# Patient Record
Sex: Female | Born: 1970
Health system: Southern US, Community
[De-identification: ages and names within clinical notes are randomized; demographics above are authoritative.]

## PROBLEM LIST (undated history)

## (undated) DIAGNOSIS — F419 Anxiety disorder, unspecified: Secondary | ICD-10-CM

## (undated) DIAGNOSIS — I10 Essential (primary) hypertension: Secondary | ICD-10-CM

## (undated) HISTORY — DX: Essential (primary) hypertension: I10

## (undated) HISTORY — PX: HERNIA REPAIR: SHX51

---

## 2020-11-20 LAB — LAB REPORT - SCANNED: Cologuard: NEGATIVE

## 2020-11-27 LAB — COLOGUARD: COLOGUARD: NEGATIVE

## 2021-05-18 ENCOUNTER — Ambulatory Visit: Admit: 2021-05-18 | Payer: Self-pay

## 2021-05-18 ENCOUNTER — Ambulatory Visit
Admission: EM | Admit: 2021-05-18 | Discharge: 2021-05-18 | Disposition: A | Discharge status: Home / Self Care | Payer: Self-pay | Attending: Emergency Medicine | Admitting: Emergency Medicine

## 2021-05-18 ENCOUNTER — Other Ambulatory Visit: Payer: Self-pay

## 2021-05-18 DIAGNOSIS — R030 Elevated blood-pressure reading, without diagnosis of hypertension: Secondary | ICD-10-CM

## 2021-05-18 DIAGNOSIS — F418 Other specified anxiety disorders: Secondary | ICD-10-CM

## 2021-05-18 MED ORDER — LORAZEPAM 1 MG PO TABS: 1.0000 mg | ORAL_TABLET | ORAL | 0 refills | Status: DC | PRN

## 2021-05-18 NOTE — Discharge Instructions (Addendum)
After speaking with you today and hearing the recent sources of stress in your life which are significant in my opinion, I believe that you are suffering from whitecoat syndrome without diagnosis of hypertension.  For this reason, I provided you with a prescription for 2 tablets of lorazepam 1 mg to be taken as needed prior to dental procedures in hopes that this prevents you from having an elevated blood pressure read when you go to have dental work done.  Please take 1 tablet 1 to 2 hours prior to when you plan to have your procedure done.  Please do not drive while taking this medication.  We will assist you in finding a primary care provider today, expect to receive a phone call in the next day or 2 to help you set something up.

## 2021-05-18 NOTE — ED Provider Notes (Signed)
[facility]    CSN: [id number] Arrival date & time: 05/18/21  1249      History   Chief Complaint No chief complaint on file.   HPI Janice Ramirez is a 50 y.o. female.   Patient states she was advised by her dentist yesterday that she has hypertension and was advised to seek medical attention before they will perform her dental visit.  Patient denies a history of hypertension diagnosis, states she has never been treated for high blood pressure.  Blood pressure on arrival today was 166/76.  After speaking with the patient for a few minutes discussing her current stressors including recently breaking a tooth, recently moving to [place] from [place], not having a primary care provider, not currently having any active health insurance or dental insurance as well as now being told that she may have hypertension, I rechecked her blood pressure and it rose to 189/104.  Patient does not report a family history of hypertension in her mother, states her mother is now 3 years old and doing very well and blood pressure is very well managed.  Patient states that she has essentially no meaningful past medical history, states she is athletic, exercises regularly, eats well, prior to move [place] had regular visits with her primary care provider.  Patient states she has been advised that she has a benign essential murmur but that her primary care provider was not concerned about it and did not believe that she required further evaluation or treatment.  The history is provided by the patient.   History reviewed. No pertinent past medical history.  There are no problems to display for this patient.   History reviewed. No pertinent surgical history.  OB History   No obstetric history on file.      Home Medications    Prior to Admission medications   Medication Sig Start Date End Date Taking? Authorizing Provider  LORazepam (ATIVAN) 1 MG tablet Take 1 tablet (1 mg  total) by mouth as needed for up to 2 doses for anxiety or sedation (For dental procedures). 05/18/21  Yes Lynden Oxford Scales, PA-C    Family History No family history on file.  Social History Social History   Tobacco Use   Smoking status: Some Days    Types: Cigarettes   Smokeless tobacco: Never  Vaping Use   Vaping Use: Some days  Substance Use Topics   Alcohol use: Yes   Drug use: Yes    Types: Marijuana     Allergies   Patient has no allergy information on record.   Review of Systems Review of Systems Pertinent findings noted in history of present illness.    Physical Exam Triage Vital Signs ED Triage Vitals  Enc Vitals Group     BP 05/11/21 0827 (!) 147/82     Pulse Rate 05/11/21 0827 72     Resp 05/11/21 0827 18     Temp 05/11/21 0827 98.3 F (36.8 C)     Temp Source 05/11/21 0827 Oral     SpO2 05/11/21 0827 98 %     Weight --      Height --      Head Circumference --      Peak Flow --      Pain Score 05/11/21 0826 5     Pain Loc --      Pain Edu? --      Excl. in [place]? --    No data found.  Updated  Vital Signs BP (!) 166/76 (BP Location: Left Arm)   Pulse 64   Temp 98.4 F (36.9 C) (Oral)   Resp 18   LMP 05/04/2021   SpO2 100%   Visual Acuity Right Eye Distance:   Left Eye Distance:   Bilateral Distance:    Right Eye Near:   Left Eye Near:    Bilateral Near:     Physical Exam Vitals and nursing note reviewed.  Constitutional:      General: She is not in acute distress.    Appearance: Normal appearance. She is not ill-appearing.     Comments: Patient appears mildly anxious today.  HENT:     Head: Normocephalic and atraumatic.  Eyes:     General: Lids are normal.        Right eye: No discharge.        Left eye: No discharge.     Extraocular Movements: Extraocular movements intact.     Conjunctiva/sclera: Conjunctivae normal.     Right eye: Right conjunctiva is not injected.     Left eye: Left conjunctiva is not injected.   Neck:     Trachea: Trachea and phonation normal.  Cardiovascular:     Rate and Rhythm: Normal rate and regular rhythm.     Pulses: Normal pulses.     Heart sounds: Murmur heard.    No friction rub. No gallop.  Pulmonary:     Effort: Pulmonary effort is normal. No accessory muscle usage, prolonged expiration or respiratory distress.     Breath sounds: Normal breath sounds. No stridor, decreased air movement or transmitted upper airway sounds. No decreased breath sounds, wheezing, rhonchi or rales.  Chest:     Chest wall: No tenderness.  Musculoskeletal:        General: Normal range of motion.     Cervical back: Normal range of motion and neck supple. Normal range of motion.  Lymphadenopathy:     Cervical: No cervical adenopathy.  Skin:    General: Skin is warm and dry.     Findings: No erythema or rash.  Neurological:     General: No focal deficit present.     Mental Status: She is alert and oriented to person, place, and time.  Psychiatric:        Mood and Affect: Mood normal.        Behavior: Behavior normal.     UC Treatments / Results  Labs (all labs ordered are listed, but only abnormal results are displayed) Labs Reviewed - No data to display  EKG   Radiology No results found.  Procedures Procedures (including critical care time)  Medications Ordered in UC Medications - No data to display  Initial Impression / Assessment and Plan / UC Course  I have reviewed the triage vital signs and the nursing notes.  Pertinent labs & imaging results that were available during my care of the patient were reviewed by me and considered in my medical decision making (see chart for details).     Patient I had a long discussion about dental anxiety, whitecoat syndrome and her risk of hypertension being African-American and also having history of hypertension in her family.  I was unable to get an accurate blood pressure reading on her today, patient was in no apparent  distress other than appearing anxious about her current situation.  I recommended that she consider taking a benzodiazepine prior to her next dental visit and that I will document my findings in her after visit  summary today so she can take that paper with her to her next dental visit.  We will also assist her with finding a primary care provider.  Patient verbalized understanding and agreement of plan as discussed.  I sent prescription for lorazepam 1 mg, 2 doses to her pharmacy today.  All questions were addressed during visit.  Please see discharge instructions below for further details of plan.  Final Clinical Impressions(s) / UC Diagnoses   Final diagnoses:  White coat syndrome without diagnosis of hypertension  Situational anxiety     Discharge Instructions      After speaking with you today and hearing the recent sources of stress in your life which are significant in my opinion, I believe that you are suffering from whitecoat syndrome without diagnosis of hypertension.  For this reason, I provided you with a prescription for 2 tablets of lorazepam 1 mg to be taken as needed prior to dental procedures in hopes that this prevents you from having an elevated blood pressure read when you go to have dental work done.  Please take 1 tablet 1 to 2 hours prior to when you plan to have your procedure done.  Please do not drive while taking this medication.  We will assist you in finding a primary care provider today, expect to receive a phone call in the next day or 2 to help you set something up.     ED Prescriptions     Medication Sig Dispense Auth. Provider   LORazepam (ATIVAN) 1 MG tablet Take 1 tablet (1 mg total) by mouth as needed for up to 2 doses for anxiety or sedation (For dental procedures). 2 tablet Lynden Oxford Scales, PA-C      PDMP not reviewed this encounter.    Lynden Oxford Scales, PA-C 05/18/21 1709

## 2021-05-18 NOTE — ED Triage Notes (Signed)
Pt reports at dental appt yesterday she had hypertension. Pt denies having a hx of HTN, denies vision changes and headaches.

## 2021-06-12 ENCOUNTER — Ambulatory Visit (HOSPITAL_COMMUNITY): Admission: EM | Admit: 2021-06-12 | Payer: Self-pay

## 2021-06-13 ENCOUNTER — Other Ambulatory Visit: Payer: Self-pay

## 2021-06-13 ENCOUNTER — Encounter (HOSPITAL_COMMUNITY): Payer: Self-pay

## 2021-06-13 ENCOUNTER — Ambulatory Visit (HOSPITAL_COMMUNITY)
Admission: RE | Admit: 2021-06-13 | Discharge: 2021-06-13 | Disposition: A | Discharge status: Home / Self Care | Payer: Self-pay | Source: Ambulatory Visit | Attending: Urgent Care | Admitting: Urgent Care

## 2021-06-13 VITALS — BP 156/82 | HR 72 | Temp 98.3°F | Resp 18

## 2021-06-13 DIAGNOSIS — I10 Essential (primary) hypertension: Secondary | ICD-10-CM

## 2021-06-13 DIAGNOSIS — R030 Elevated blood-pressure reading, without diagnosis of hypertension: Secondary | ICD-10-CM

## 2021-06-13 DIAGNOSIS — R519 Headache, unspecified: Secondary | ICD-10-CM

## 2021-06-13 HISTORY — DX: Anxiety disorder, unspecified: F41.9

## 2021-06-13 MED ORDER — AMLODIPINE BESYLATE 5 MG PO TABS: 5.0000 mg | ORAL_TABLET | Freq: Every day | ORAL | 0 refills | Status: DC

## 2021-06-13 NOTE — ED Triage Notes (Signed)
Pt presents with intermittent headache X 2 weeks.

## 2021-06-13 NOTE — Discharge Instructions (Addendum)
Do not use any nonsteroidal anti-inflammatories (NSAIDs) like ibuprofen, Motrin, naproxen, Aleve, etc. which are all available over-the-counter.  Please just use Tylenol at a dose of 557m-650mg once every 6 hours as needed for your headaches, pains, fevers. You can also use Excedrin for your headaches.

## 2021-06-13 NOTE — ED Provider Notes (Signed)
[place]   MRN: [id number] DOB: [birth year]  Subjective:   Janice Ramirez is a 50 y.o. female presenting for 2-week history of persistent intermittent left-sided frontal to left temporal headaches with intermittent buzzing in her left ear.  Has also had occasional oral pain.  Denies fevers, blurred vision, photophobia, weakness, numbness or tingling, neck pain, jaw pain, chest pain, shortness of breath, heart racing, nausea, vomiting, abdominal pain, hematuria.  Has 1 cigarette in the morning with her coffee.  No neck stiffness, neck pain.  Patient's mother does have a history of hypertension.  No other family history.  Patient has never been diagnosed with this either.  She admits that she has been under a lot of stress that she is trying to apply for the bar.  Otherwise she tries to eat healthily, exercises regularly.  She is trying to get established with a PCP, has an appointment in January.  No current facility-administered medications for this encounter.  Current Outpatient Medications:    LORazepam (ATIVAN) 1 MG tablet, Take 1 tablet (1 mg total) by mouth as needed for up to 2 doses for anxiety or sedation (For dental procedures)., Disp: 2 tablet, Rfl: 0   No Known Allergies  Past Medical History:  Diagnosis Date   Anxiety      History reviewed. No pertinent surgical history.  Family History  Family history unknown: Yes    Social History   Tobacco Use   Smoking status: Some Days    Types: Cigarettes   Smokeless tobacco: Never  Vaping Use   Vaping Use: Some days  Substance Use Topics   Alcohol use: Yes   Drug use: Yes    Types: Marijuana    ROS   Objective:   Vitals: BP (!) 156/82 (BP Location: Right Arm)   Pulse 72   Temp 98.3 F (36.8 C) (Oral)   Resp 18   SpO2 99%   BP Readings from Last 3 Encounters:  06/13/21 (!) 156/82  05/18/21 (!) 166/76   Physical Exam Constitutional:      General: She is not in acute distress.     Appearance: Normal appearance. She is well-developed. She is not ill-appearing.  HENT:     Head: Normocephalic and atraumatic.     Right Ear: Tympanic membrane and ear canal normal. No drainage or tenderness. No middle ear effusion. Tympanic membrane is not erythematous.     Left Ear: Tympanic membrane and ear canal normal. No drainage or tenderness.  No middle ear effusion. Tympanic membrane is not erythematous.     Nose: Nose normal. No congestion or rhinorrhea.     Mouth/Throat:     Mouth: Mucous membranes are moist. No oral lesions.     Pharynx: Oropharynx is clear. No pharyngeal swelling, oropharyngeal exudate, posterior oropharyngeal erythema or uvula swelling.     Tonsils: No tonsillar exudate or tonsillar abscesses.  Eyes:     General: No scleral icterus.       Right eye: No discharge.        Left eye: No discharge.     Extraocular Movements: Extraocular movements intact.     Right eye: Normal extraocular motion.     Left eye: Normal extraocular motion.     Conjunctiva/sclera: Conjunctivae normal.     Pupils: Pupils are equal, round, and reactive to light.  Cardiovascular:     Rate and Rhythm: Normal rate.  Pulmonary:     Effort: Pulmonary effort is normal.  Musculoskeletal:  Cervical back: Normal range of motion and neck supple.  Lymphadenopathy:     Cervical: No cervical adenopathy.  Skin:    General: Skin is warm and dry.  Neurological:     General: No focal deficit present.     Mental Status: She is alert and oriented to person, place, and time.     Cranial Nerves: No cranial nerve deficit, dysarthria or facial asymmetry.     Motor: No weakness, tremor, abnormal muscle tone or pronator drift.     Coordination: Romberg sign negative. Coordination normal. Finger-Nose-Finger Test and Heel to [facility] Test normal. Rapid alternating movements normal.     Gait: Gait normal.     Deep Tendon Reflexes: Reflexes normal.  Psychiatric:        Mood and Affect: Mood normal.         Behavior: Behavior normal.        Thought Content: Thought content normal.        Judgment: Judgment normal.    Assessment and Plan :   PDMP not reviewed this encounter.  1. Essential hypertension   2. Elevated blood pressure reading   3. Intermittent headache    Patient has a completely normal neurologic exam, clear cardiopulmonary exam, no carotid bruits.  Low suspicion for an acute encephalopathy, stroke, meningitis.  Discussed hypertension as a source of her symptoms. Will have her start amlodipine. Use Tylenol, Excedrin for her headaches. Continue efforts at establishing care with a new PCP. Counseled patient on potential for adverse effects with medications prescribed/recommended today, strict ER and return-to-clinic precautions discussed, patient verbalized understanding.    Jaynee Eagles, PA-C 06/13/21 1346

## 2021-06-25 ENCOUNTER — Other Ambulatory Visit: Payer: Self-pay

## 2021-06-25 ENCOUNTER — Encounter (HOSPITAL_BASED_OUTPATIENT_CLINIC_OR_DEPARTMENT_OTHER): Payer: Self-pay | Admitting: Family Medicine

## 2021-06-25 ENCOUNTER — Ambulatory Visit (INDEPENDENT_AMBULATORY_CARE_PROVIDER_SITE_OTHER): Payer: Self-pay | Admitting: Family Medicine

## 2021-06-25 DIAGNOSIS — I10 Essential (primary) hypertension: Secondary | ICD-10-CM | POA: Insufficient documentation

## 2021-06-25 MED ORDER — AMLODIPINE BESYLATE 5 MG PO TABS: 5.0000 mg | ORAL_TABLET | Freq: Every day | ORAL | 0 refills | Status: DC

## 2021-06-25 NOTE — Progress Notes (Signed)
New Patient Office Visit  Subjective:  Patient ID: Janice Ramirez, female    DOB: Jul 19, 1970  Age: 50 y.o. MRN: [id number]  CC:  Chief Complaint  Patient presents with   Establish Care    Prior PCP in [place]. Recently relocated   Hypertension    Patient would like to discuss HTN and management. She states this is a fairly new dx for her, she found out while at the dentist. She was seen in the ED and started on a low dose of amlodipine. She states her headaches and dizziness has resolved but her BP still remains high    HPI Janice Ramirez is a 50 year old female presenting to establish in clinic.  She has current concerns as outlined above.  Reports past medical history of hypertension, recently diagnosed.  Hypertension: Recently since moving to the area she was having symptoms of intermittent headaches and was found to have elevated blood pressure emergency department.  About 2 weeks ago she was started on amlodipine.  She has been tolerating medication well.  She has been checking her blood pressure at home and reports getting readings of about 130/80.  Rarely she has had some lightheadedness/dizziness.  She has primarily been taking her blood pressure medication at night.  Does report family history of hypertension in her mother.  Patient is originally from [place], has been living in [place] since she was 15 and then recently moved here to [place] to escape the cold and pace of [place].  She is not currently working at present but is looking for a position.  She is an attorney by training, focusing on housing/property law.  Outside of work she enjoys being active, particularly walking about 5 miles per day, doing yoga, cooking, watching TV, reading.  Past Medical History:  Diagnosis Date   Anxiety    Hypertension 05/10/21    Past Surgical History:  Procedure Laterality Date   HERNIA REPAIR  Approx. 1992    Family History  Problem Relation Age of Onset    Arthritis Mother    Hypertension Mother    Early death Father     Social History   Socioeconomic History   Marital status: Single    Spouse name: Not on file   Number of children: Not on file   Years of education: Not on file   Highest education level: Not on file  Occupational History   Not on file  Tobacco Use   Smoking status: Some Days    Packs/day: 0.10    Years: 20.00    Pack years: 2.00    Types: Cigarettes, E-cigarettes   Smokeless tobacco: Never   Tobacco comments:    On and off for about 20 yrs  Vaping Use   Vaping Use: Some days  Substance and Sexual Activity   Alcohol use: Yes    Alcohol/week: 3.0 standard drinks    Types: 3 Standard drinks or equivalent per week   Drug use: Not Currently    Types: Marijuana    Comment: A few times per year   Sexual activity: Not Currently    Birth control/protection: Condom  Other Topics Concern   Not on file  Social History Narrative   Recently relocated from [place] Strain: Not on file  Food Insecurity: Not on file  Transportation Needs: Not on file  Physical Activity: Not on file  Stress: Not on file  Social Connections: Not on  file  Intimate Partner Violence: Not on file    Objective:   Today's Vitals: BP (!) 142/106   Pulse (!) 52   Ht 5' 9"  (1.753 m)   Wt 122 lb 12.8 oz (55.7 kg)   LMP 06/03/2021   SpO2 96%   BMI 18.13 kg/m   Physical Exam  50 year old female in no acute distress Cardiovascular exam with regular rate and rhythm, no murmurs appreciated Lungs clear to auscultation bilaterally  Assessment & Plan:   Problem List Items Addressed This Visit       Cardiovascular and Mediastinum   Hypertension    Recently diagnosed hypertension, blood pressure in office today is slightly elevated, possibly elevated due to new position office, environment Reports adequately controlled blood pressure readings at home At this time, will continue  with current medication regimen Discussed lifestyle modifications including DASH diet, regular physical activity Plan for follow-up in about 3 months to monitor blood pressure      Relevant Medications   amLODipine (NORVASC) 5 MG tablet    Will also provide handout of sleep hygiene guidelines due to reported issues with sleep some nights.  Outpatient Encounter Medications as of 06/25/2021  Medication Sig   LORazepam (ATIVAN) 1 MG tablet Take 1 tablet (1 mg total) by mouth as needed for up to 2 doses for anxiety or sedation (For dental procedures).   [DISCONTINUED] amLODipine (NORVASC) 5 MG tablet Take 1 tablet (5 mg total) by mouth daily.   amLODipine (NORVASC) 5 MG tablet Take 1 tablet (5 mg total) by mouth daily.   No facility-administered encounter medications on file as of 06/25/2021.    Follow-up: Return in about 3 months (around 09/23/2021) for Follow Up.  Patient also requesting referral to OB/GYN, will proceed with this once patient has insurance established.  Firas Guardado J De [place], MD

## 2021-06-25 NOTE — Assessment & Plan Note (Signed)
Recently diagnosed hypertension, blood pressure in office today is slightly elevated, possibly elevated due to new position office, environment Reports adequately controlled blood pressure readings at home At this time, will continue with current medication regimen Discussed lifestyle modifications including DASH diet, regular physical activity Plan for follow-up in about 3 months to monitor blood pressure

## 2021-06-25 NOTE — Patient Instructions (Signed)
  Medication Instructions:  Your physician recommends that you continue on your current medications as directed. Please refer to the Current Medication list given to you today. --If you need a refill on any your medications before your next appointment, please call your pharmacy first. If no refills are authorized on file call the office.--  Follow-Up: Your next appointment:   Your physician recommends that you schedule a follow-up appointment in: 3 MONTHS with Dr. de [place]  You will receive a text message or e-mail with a link to a survey about your care and experience with [place] today! We would greatly appreciate your feedback!   Thanks for letting us be apart of your health journey!!  Primary Care and Sports Medicine   Dr. Arlina Robes [place]   We encourage you to activate your patient portal called "MyChart".  Sign up information is provided on this After Visit Summary.  MyChart is used to connect with patients for Virtual Visits (Telemedicine).  Patients are able to view lab/test results, encounter notes, upcoming appointments, etc.  Non-urgent messages can be sent to your provider as well. To learn more about what you can do with MyChart, please visit --  [email or web address].

## 2021-09-24 ENCOUNTER — Ambulatory Visit (HOSPITAL_BASED_OUTPATIENT_CLINIC_OR_DEPARTMENT_OTHER): Payer: Self-pay | Admitting: Family Medicine

## 2021-10-03 ENCOUNTER — Encounter (HOSPITAL_BASED_OUTPATIENT_CLINIC_OR_DEPARTMENT_OTHER): Payer: Self-pay | Admitting: Family Medicine

## 2021-10-03 ENCOUNTER — Other Ambulatory Visit: Payer: Self-pay

## 2021-10-03 ENCOUNTER — Ambulatory Visit (INDEPENDENT_AMBULATORY_CARE_PROVIDER_SITE_OTHER): Payer: 59 | Admitting: Family Medicine

## 2021-10-03 VITALS — BP 116/66 | HR 77 | Temp 97.4°F | Ht 69.0 in | Wt 127.0 lb

## 2021-10-03 DIAGNOSIS — I10 Essential (primary) hypertension: Secondary | ICD-10-CM

## 2021-10-03 DIAGNOSIS — R1013 Epigastric pain: Secondary | ICD-10-CM | POA: Diagnosis not present

## 2021-10-03 MED ORDER — LOSARTAN POTASSIUM 50 MG PO TABS: 50.0000 mg | ORAL_TABLET | Freq: Every day | ORAL | 1 refills | Status: DC

## 2021-10-03 NOTE — Patient Instructions (Addendum)
Bland Diet ?A bland diet consists of foods that are often soft and do not have a lot of fat, fiber, or extra seasonings. Foods without fat, fiber, or seasoning are easier for the body to digest. They are also less likely to irritate your mouth, throat, stomach, and other parts of your digestive system. A bland diet is sometimes called a BRAT diet. ?What is my plan? ?Your health care provider or food and nutrition specialist (dietitian) may recommend specific changes to your diet to prevent symptoms or to treat your symptoms. These changes may include: ?Eating small meals often. ?Cooking food until it is soft enough to chew easily. ?Chewing your food well. ?Drinking fluids slowly. ?Not eating foods that are very spicy, sour, or fatty. ?Not eating citrus fruits, such as oranges and grapefruit. ?What do I need to know about this diet? ?Eat a variety of foods from the bland diet food list. ?Do not follow a bland diet longer than needed. ?Ask your health care provider whether you should take vitamins or supplements. ?What foods can I eat? ?Grains ?Hot cereals, such as cream of wheat. Rice. Bread, crackers, or tortillas made from refined white flour. ?Vegetables ?Canned or cooked vegetables. Mashed or boiled potatoes. ?Fruits ?Bananas. Applesauce. Other types of cooked or canned fruit with the skin and seeds removed, such as canned peaches or pears. ?Meats and other proteins ?Scrambled eggs. Creamy peanut butter or other nut butters. Lean, well-cooked meats, such as chicken or fish. Tofu. Soups or broths. ?Dairy ?Low-fat dairy products, such as milk, cottage cheese, or yogurt. ?Beverages ?Water. Herbal tea. Apple juice. ?Fats and oils ?Mild salad dressings. [place]ola or olive oil. ?Sweets and desserts ?Pudding. Custard. Fruit gelatin. Ice cream. ?The items listed above may not be a complete list of recommended foods and beverages. Contact a dietitian for more options. ?What foods are not recommended? ?Grains ?Whole grain  breads and cereals. ?Vegetables ?Raw vegetables. ?Fruits ?Raw fruits, especially citrus, berries, or dried fruits. ?Dairy ?Whole fat dairy foods. ?Beverages ?Caffeinated drinks. Alcohol. ?Seasonings and condiments ?Strongly flavored seasonings or condiments. Hot sauce. Salsa. ?Other foods ?Spicy foods. Fried foods. Sour foods, such as pickled or fermented foods. Foods with high sugar content. Foods high in fiber. ?The items listed above may not be a complete list of foods and beverages to avoid. Contact a dietitian for more information. ?Summary ?A bland diet consists of foods that are often soft and do not have a lot of fat, fiber, or extra seasonings. ?Foods without fat, fiber, or seasoning are easier for the body to digest. ?Check with your health care provider to see how long you should follow this diet plan. It is not meant to be followed for long periods. ?This information is not intended to replace advice given to you by your health care provider. Make sure you discuss any questions you have with your health care provider. ?Document Revised: 07/30/2017 Document Reviewed: 07/30/2017 ?Elsevier Patient Education ? [place]. ? ?Medication Instructions:  ?Your physician recommends that you continue on your current medications as directed. Please refer to the Current Medication list given to you today. ?--If you need a refill on any your medications before your next appointment, please call your pharmacy first. If no refills are authorized on file call the office.-- ? ? ? ? ?Follow-Up: ?Your next appointment:   ?Your physician recommends that you schedule a follow-up appointment in: 1 month  with Dr. de [place] ? ?You will receive a text message or e-mail with a  link to a survey about your care and experience with [place] today! We would greatly appreciate your feedback!  ? ?Thanks for letting us be apart of your health journey!!  ?Primary Care and Sports Medicine  ? ?Dr. Kyung Rudd de [place]  ? ?We encourage you to  activate your patient portal called "MyChart".  Sign up information is provided on this After Visit Summary.  MyChart is used to connect with patients for Virtual Visits (Telemedicine).  Patients are able to view lab/test results, encounter notes, upcoming appointments, etc.  Non-urgent messages can be sent to your provider as well. To learn more about what you can do with MyChart, please visit --  [email or web address].   ? ?

## 2021-10-03 NOTE — Assessment & Plan Note (Signed)
Patient with new onset reflux symptoms, some associated bloating as well.  She was relatively recently started on amlodipine, it is possible that current symptoms are related to this new medication.  She denies any issues in the past with reflux or bloating and thus this is a brand-new issue for patient ?Discussed options with patient, will proceed with changes as outlined above and monitor for improvement in reflux symptoms ?

## 2021-10-03 NOTE — Progress Notes (Signed)
? ? ?  Procedures performed today:   ? ?None. ? ?Independent interpretation of notes and tests performed by another provider:  ? ?None. ? ?Brief History, Exam, Impression, and Recommendations:   ? ?BP 116/66   Pulse 77   Temp (!) 97.4 ?F (36.3 ?C)   Ht 5' 9"  (1.753 m)   Wt 127 lb (57.6 kg)   SpO2 94%   BMI 18.75 kg/m?  ? ?Hypertension ?Blood pressure at goal in office today ?Unfortunately, she has had new symptoms of reflux, bloating.  It is possible that these are related to the amlodipine.  Discussed options with patient, at this time we will proceed with discontinuation of amlodipine and initiation of losartan ?Continue to monitor symptoms, would expect improvement in reflux symptoms over the coming weeks with discontinuation of amlodipine.  If reflux continues to be an issue, then likely treat accordingly with pharmacotherapy, consider referral to GI for further evaluation given new onset reflux issues and patient age ? ?Epigastric pain ?Patient with new onset reflux symptoms, some associated bloating as well.  She was relatively recently started on amlodipine, it is possible that current symptoms are related to this new medication.  She denies any issues in the past with reflux or bloating and thus this is a brand-new issue for patient ?Discussed options with patient, will proceed with changes as outlined above and monitor for improvement in reflux symptoms ? ?Plan for follow-up in about 1 month or sooner as needed ? ? ?___________________________________________ ?Cherine Drumgoole de [place], MD, ABFM, CAQSM ?Primary Care and Sports Medicine ?[place] ?

## 2021-10-03 NOTE — Assessment & Plan Note (Signed)
Blood pressure at goal in office today ?Unfortunately, she has had new symptoms of reflux, bloating.  It is possible that these are related to the amlodipine.  Discussed options with patient, at this time we will proceed with discontinuation of amlodipine and initiation of losartan ?Continue to monitor symptoms, would expect improvement in reflux symptoms over the coming weeks with discontinuation of amlodipine.  If reflux continues to be an issue, then likely treat accordingly with pharmacotherapy, consider referral to GI for further evaluation given new onset reflux issues and patient age ?

## 2021-11-02 ENCOUNTER — Ambulatory Visit (INDEPENDENT_AMBULATORY_CARE_PROVIDER_SITE_OTHER): Payer: 59 | Admitting: Family Medicine

## 2021-11-02 ENCOUNTER — Encounter (HOSPITAL_BASED_OUTPATIENT_CLINIC_OR_DEPARTMENT_OTHER): Payer: Self-pay | Admitting: Family Medicine

## 2021-11-02 VITALS — BP 122/82 | HR 86 | Ht 69.0 in | Wt 122.6 lb

## 2021-11-02 DIAGNOSIS — I10 Essential (primary) hypertension: Secondary | ICD-10-CM

## 2021-11-02 DIAGNOSIS — Z0000 Encounter for general adult medical examination without abnormal findings: Secondary | ICD-10-CM

## 2021-11-02 MED ORDER — LOSARTAN POTASSIUM 50 MG PO TABS: 50.0000 mg | ORAL_TABLET | Freq: Every day | ORAL | 1 refills | Status: DC

## 2021-11-02 NOTE — Assessment & Plan Note (Signed)
Patient reports to be doing great.  She reports that the abdominal bloating that she was having has completely resolved.  She has not had any issues with chest pain or headaches.  Has not been checking her blood pressure at home given that she has been feeling much better ?Blood pressure is at goal in office today, diastolic is slightly borderline ?At this time, we will continue with losartan 50 mg ?Recommend intermittent monitoring of blood pressure at home, DASH diet, regular aerobic exercise ?

## 2021-11-02 NOTE — Progress Notes (Signed)
? ? ?  Procedures performed today:   ? ?None. ? ?Independent interpretation of notes and tests performed by another provider:  ? ?None. ? ?Brief History, Exam, Impression, and Recommendations:   ? ?BP 122/82   Pulse 86   Ht 5' 9"  (1.753 m)   Wt 122 lb 9.6 oz (55.6 kg)   SpO2 97%   BMI 18.10 kg/m?  ? ?Hypertension ?Patient reports to be doing great.  She reports that the abdominal bloating that she was having has completely resolved.  She has not had any issues with chest pain or headaches.  Has not been checking her blood pressure at home given that she has been feeling much better ?Blood pressure is at goal in office today, diastolic is slightly borderline ?At this time, we will continue with losartan 50 mg ?Recommend intermittent monitoring of blood pressure at home, DASH diet, regular aerobic exercise ? ?Plan for follow-up in the next 1 to 2 weeks for CPE, nurse visit for labs a few days before ?Likely next visit after that would be in about 6 months to follow-up on blood pressure ? ? ?___________________________________________ ?Jen Benedict [place], MD, ABFM, CAQSM ?Primary Care and Sports Medicine ?[place] ?

## 2021-11-05 ENCOUNTER — Ambulatory Visit (HOSPITAL_BASED_OUTPATIENT_CLINIC_OR_DEPARTMENT_OTHER): Payer: 59

## 2021-11-05 ENCOUNTER — Other Ambulatory Visit (HOSPITAL_BASED_OUTPATIENT_CLINIC_OR_DEPARTMENT_OTHER): Payer: Self-pay | Admitting: Family Medicine

## 2021-11-06 ENCOUNTER — Encounter: Payer: Self-pay | Admitting: Obstetrics

## 2021-11-06 LAB — LIPID PANEL
Chol/HDL Ratio: 2.3 ratio (ref 0.0–4.4)
Cholesterol, Total: 208 mg/dL — ABNORMAL HIGH (ref 100–199)
HDL: 90 mg/dL (ref >39)
LDL Chol Calc (NIH): 106 mg/dL — ABNORMAL HIGH (ref 0–99)
Triglycerides: 68 mg/dL (ref 0–149)
VLDL Cholesterol Cal: 12 mg/dL (ref 5–40)

## 2021-11-06 LAB — COMPREHENSIVE METABOLIC PANEL
ALT: 10 IU/L (ref 0–32)
AST: 21 IU/L (ref 0–40)
Albumin/Globulin Ratio: 1.8 (ref 1.2–2.2)
Albumin: 4.6 g/dL (ref 3.8–4.8)
Alkaline Phosphatase: 59 IU/L (ref 44–121)
BUN/Creatinine Ratio: 9 (ref 9–23)
BUN: 7 mg/dL (ref 6–24)
Bilirubin Total: 0.5 mg/dL (ref 0.0–1.2)
CO2: 26 mmol/L (ref 20–29)
Calcium: 9.7 mg/dL (ref 8.7–10.2)
Chloride: 100 mmol/L (ref 96–106)
Creatinine, Ser: 0.8 mg/dL (ref 0.57–1.00)
Globulin, Total: 2.5 g/dL (ref 1.5–4.5)
Glucose: 81 mg/dL (ref 70–99)
Potassium: 4.9 mmol/L (ref 3.5–5.2)
Sodium: 142 mmol/L (ref 134–144)
Total Protein: 7.1 g/dL (ref 6.0–8.5)
eGFR: 90 mL/min/{1.73_m2} (ref >59)

## 2021-11-06 LAB — CBC WITH DIFFERENTIAL/PLATELET
Basophils Absolute: 0.1 10*3/uL (ref 0.0–0.2)
Basos: 1 %
EOS (ABSOLUTE): 0.2 10*3/uL (ref 0.0–0.4)
Eos: 6 %
Hematocrit: 41.2 % (ref 34.0–46.6)
Hemoglobin: 13.6 g/dL (ref 11.1–15.9)
Immature Grans (Abs): 0 10*3/uL (ref 0.0–0.1)
Immature Granulocytes: 0 %
Lymphocytes Absolute: 1.6 10*3/uL (ref 0.7–3.1)
Lymphs: 46 %
MCH: 30.1 pg (ref 26.6–33.0)
MCHC: 33 g/dL (ref 31.5–35.7)
MCV: 91 fL (ref 79–97)
Monocytes Absolute: 0.5 10*3/uL (ref 0.1–0.9)
Monocytes: 13 %
Neutrophils Absolute: 1.2 10*3/uL — ABNORMAL LOW (ref 1.4–7.0)
Neutrophils: 34 %
Platelets: 265 10*3/uL (ref 150–450)
RBC: 4.52 x10E6/uL (ref 3.77–5.28)
RDW: 11.9 % (ref 11.7–15.4)
WBC: 3.5 10*3/uL (ref 3.4–10.8)

## 2021-11-06 LAB — TSH RFX ON ABNORMAL TO FREE T4: TSH: 1.73 u[IU]/mL (ref 0.450–4.500)

## 2021-11-12 NOTE — Progress Notes (Signed)
Pt seen lab results in mychart. I also tried to call and reach out to see if she had any questions or concerns about her labs. Left voicemail since pt didn't answer.

## 2021-11-16 ENCOUNTER — Encounter (HOSPITAL_BASED_OUTPATIENT_CLINIC_OR_DEPARTMENT_OTHER): Payer: 59 | Admitting: Family Medicine

## 2021-12-03 ENCOUNTER — Other Ambulatory Visit (HOSPITAL_COMMUNITY)
Admission: RE | Admit: 2021-12-03 | Discharge: 2021-12-03 | Disposition: A | Discharge status: Home / Self Care | Payer: 59 | Source: Ambulatory Visit | Attending: Obstetrics and Gynecology | Admitting: Obstetrics and Gynecology

## 2021-12-03 ENCOUNTER — Ambulatory Visit (INDEPENDENT_AMBULATORY_CARE_PROVIDER_SITE_OTHER): Payer: 59 | Admitting: Obstetrics and Gynecology

## 2021-12-03 ENCOUNTER — Encounter: Payer: Self-pay | Admitting: Obstetrics and Gynecology

## 2021-12-03 VITALS — Ht 68.5 in | Wt 128.3 lb

## 2021-12-03 DIAGNOSIS — Z01419 Encounter for gynecological examination (general) (routine) without abnormal findings: Secondary | ICD-10-CM | POA: Insufficient documentation

## 2021-12-03 NOTE — Progress Notes (Signed)
Subjective:     Janice Ramirez is a 51 y.o. female P2 with LMP 11/23/21 and BMI 19 who is here for a comprehensive physical exam. The patient reports no problems. She reports a monthly period lasting 4-7 days with cycles now becoming longer than 27 days. She is sexually active without complaints. She desires STI testing. She denies pelvic pain or abnormal discharge. She denies urinary incontinence. She denies vasomotor symptoms.  Past Medical History:  Diagnosis Date   Anxiety    Hypertension 05/10/21   Past Surgical History:  Procedure Laterality Date   HERNIA REPAIR  Approx. 1992   Family History  Problem Relation Age of Onset   Arthritis Mother    Hypertension Mother    Early death Father     Social History   Socioeconomic History   Marital status: Single    Spouse name: Not on file   Number of children: Not on file   Years of education: Not on file   Highest education level: Not on file  Occupational History   Not on file  Tobacco Use   Smoking status: Some Days    Packs/day: 0.10    Years: 20.00    Pack years: 2.00    Types: Cigarettes, E-cigarettes   Smokeless tobacco: Never   Tobacco comments:    On and off for about 20 yrs  Vaping Use   Vaping Use: Some days  Substance and Sexual Activity   Alcohol use: Yes    Alcohol/week: 3.0 standard drinks    Types: 3 Standard drinks or equivalent per week   Drug use: Not Currently    Types: Marijuana    Comment: A few times per year   Sexual activity: Not Currently    Birth control/protection: Condom  Other Topics Concern   Not on file  Social History Narrative   Recently relocated from [place] Strain: Not on file  Food Insecurity: Not on file  Transportation Needs: Not on file  Physical Activity: Not on file  Stress: Not on file  Social Connections: Not on file  Intimate Partner Violence: Not on file   Health Maintenance  Topic Date Due    COVID-19 Vaccine (1) Never done   HIV Screening  Never done   Hepatitis C Screening  Never done   TETANUS/TDAP  Never done   PAP SMEAR-Modifier  Never done   COLONOSCOPY (Pts 45-59yr Insurance coverage will need to be confirmed)  Never done   MAMMOGRAM  Never done   Zoster Vaccines- Shingrix (1 of 2) Never done   INFLUENZA VACCINE  02/12/2022   HPV VACCINES  Aged Out       Review of Systems Pertinent items noted in HPI and remainder of comprehensive ROS otherwise negative.   Objective:  Height 5' 8.5" (1.74 m), weight 128 lb 4.8 oz (58.2 kg), last menstrual period 11/23/2021.   GENERAL: Well-developed, well-nourished female in no acute distress.  HEENT: Normocephalic, atraumatic. Sclerae anicteric.  NECK: Supple. Normal thyroid.  LUNGS: Clear to auscultation bilaterally.  HEART: Regular rate and rhythm. BREASTS: Symmetric in size. No palpable masses or lymphadenopathy, skin changes, or nipple drainage. ABDOMEN: Soft, nontender, nondistended. No organomegaly. PELVIC: Normal external female genitalia. Vagina is pink and rugated.  Normal discharge. Normal appearing cervix. Uterus is normal in size. No adnexal mass or tenderness. Chaperone present during the pelvic exam EXTREMITIES: No cyanosis, clubbing, or edema, 2+ distal pulses.  Assessment:    Healthy female exam.      Plan:    Pap smear collected Screening mammogram ordered STI screening per patient request Patient will be contacted with abnormal results See After Visit Summary for Counseling Recommendations

## 2021-12-03 NOTE — Progress Notes (Signed)
NGYN pt in office to establish care and STD testing. Pt c/o of irregular cycles and feels she may be premenopausal.

## 2021-12-04 ENCOUNTER — Ambulatory Visit (INDEPENDENT_AMBULATORY_CARE_PROVIDER_SITE_OTHER): Payer: 59 | Admitting: Licensed Clinical Social Worker

## 2021-12-04 DIAGNOSIS — F4322 Adjustment disorder with anxiety: Secondary | ICD-10-CM

## 2021-12-04 LAB — HEPATITIS C ANTIBODY: Hep C Virus Ab: NONREACTIVE

## 2021-12-04 LAB — RPR: RPR Ser Ql: NONREACTIVE

## 2021-12-04 LAB — HIV ANTIBODY (ROUTINE TESTING W REFLEX): HIV Screen 4th Generation wRfx: NONREACTIVE

## 2021-12-05 LAB — CERVICOVAGINAL ANCILLARY ONLY
Chlamydia: NEGATIVE
Comment: NEGATIVE
Comment: NORMAL
Neisseria Gonorrhea: NEGATIVE

## 2021-12-05 NOTE — BH Specialist Note (Signed)
Integrated Behavioral Health via Telemedicine Visit  12/05/2021 Janice Ramirez [id number]  Number of Integrated Behavioral Health Clinician visits: 1- Initial Visit  Session Start time: 0920   Session End time: 3267  Total time in minutes: 63   Referring Provider: Dr. Elly Modena  Patient/Family location: Home  [facility] Provider location: [place] All persons participating in visit: Janice Ramirez and LCSW A. Otoniel Myhand  Types of Service: Individual psychotherapy and Video visit  I connected with Janice Ramirez and/or Janice Ramirez's n/a via  Telephone or Video Enabled Telemedicine Application  (Video is Caregility application) and verified that I am speaking with the correct person using two identifiers. Discussed confidentiality: Yes   I discussed the limitations of telemedicine and the availability of in person appointments.  Discussed there is a possibility of technology failure and discussed alternative modes of communication if that failure occurs.  I discussed that engaging in this telemedicine visit, they consent to the provision of behavioral healthcare and the services will be billed under their insurance.  Patient and/or legal guardian expressed understanding and consented to Telemedicine visit: Yes   Presenting Concerns: Patient and/or family reports the following symptoms/concerns: adjustment disorder with anxious mood  Duration of problem: approx 9 months ; Severity of problem: mild  Patient and/or Family's Strengths/Protective Factors: Concrete supports in place (healthy food, safe environments, etc.)  Goals Addressed: Patient will:  Reduce symptoms of: adjustment disorder with anxious mood   Increase knowledge and/or ability of: coping skills   Demonstrate ability to: Increase healthy adjustment to current life circumstances  Progress towards Goals: Ongoing  Interventions: Interventions utilized:  Supportive Counseling and Link to [employer] Standardized Assessments completed: PHQ 9  Patient and/or Family Response: Janice Ramirez responded well to visit.  Assessment: Patient currently experiencing adjustment disorder with anxious mood.   Patient may benefit from integrated behavioral health.  Plan: Follow up with behavioral health clinician on : 12/12/2021 Behavioral recommendations:  Engage in social groups to decrease social isolation, create and maintain boundaries, engage in self care  Referral(s): [place] (In Clinic)  I discussed the assessment and treatment plan with the patient and/or parent/guardian. They were provided an opportunity to ask questions and all were answered. They agreed with the plan and demonstrated an understanding of the instructions.   They were advised to call back or seek an in-person evaluation if the symptoms worsen or if the condition fails to improve as anticipated.  Lynnea Ferrier, LCSW

## 2021-12-07 ENCOUNTER — Ambulatory Visit (HOSPITAL_BASED_OUTPATIENT_CLINIC_OR_DEPARTMENT_OTHER)
Admission: RE | Admit: 2021-12-07 | Discharge: 2021-12-07 | Disposition: A | Discharge status: Home / Self Care | Payer: 59 | Source: Ambulatory Visit | Attending: Obstetrics and Gynecology | Admitting: Obstetrics and Gynecology

## 2021-12-07 DIAGNOSIS — Z1231 Encounter for screening mammogram for malignant neoplasm of breast: Secondary | ICD-10-CM | POA: Diagnosis not present

## 2021-12-07 DIAGNOSIS — Z01419 Encounter for gynecological examination (general) (routine) without abnormal findings: Secondary | ICD-10-CM | POA: Diagnosis present

## 2021-12-07 LAB — CYTOLOGY - PAP
Comment: NEGATIVE
Comment: NEGATIVE
Comment: NEGATIVE
Diagnosis: NEGATIVE
HPV 16: NEGATIVE
HPV 18 / 45: NEGATIVE
High risk HPV: POSITIVE — AB

## 2021-12-12 ENCOUNTER — Ambulatory Visit (INDEPENDENT_AMBULATORY_CARE_PROVIDER_SITE_OTHER): Payer: 59 | Admitting: Licensed Clinical Social Worker

## 2021-12-12 DIAGNOSIS — F4322 Adjustment disorder with anxiety: Secondary | ICD-10-CM

## 2021-12-14 NOTE — BH Specialist Note (Signed)
Integrated Behavioral Health via Telemedicine Visit  12/14/2021 Siren Porrata [id number]  Number of Integrated Behavioral Health Clinician visits: 2- Second Visit  Session Start time: 9741   Session End time: 1412  Total time in minutes: 52 Via mychart video   Referring Provider: Dr. Elly Modena  Patient/Family location: Home  [facility] Provider location: [place]  All persons participating in visit: Pt B Metivier and LCSW A. Khy Pitre  Types of Service: Individual psychotherapy and Video visit  I connected with [name] and/or Lecretia Chinn's n/a via  Telephone or Video Enabled Telemedicine Application  (Video is Caregility application) and verified that I am speaking with the correct person using two identifiers. Discussed confidentiality: Yes   I discussed the limitations of telemedicine and the availability of in person appointments.  Discussed there is a possibility of technology failure and discussed alternative modes of communication if that failure occurs.  I discussed that engaging in this telemedicine visit, they consent to the provision of behavioral healthcare and the services will be billed under their insurance.  Patient and/or legal guardian expressed understanding and consented to Telemedicine visit: Yes   Presenting Concerns: Patient and/or family reports the following symptoms/concerns: anxious mood  Duration of problem: approx 9 months; Severity of problem: mild  Patient and/or Family's Strengths/Protective Factors: Concrete supports in place (healthy food, safe environments, etc.)  Goals Addressed: Patient will:  Reduce symptoms of: adjustment disorder    Increase knowledge and/or ability of: coping skills   Demonstrate ability to: Increase healthy adjustment to current life circumstances  Progress towards Goals: Ongoing  Interventions: Interventions utilized:  Supportive Counseling Standardized Assessments completed: Not  Needed  Patient and/or Family Response: Ms. Utke responded well to visit   Assessment: Patient currently experiencing adjustment disorder with anxious mood.   Patient may benefit from integrated behavioral health.  Plan: Follow up with behavioral health clinician on : as needed  Behavioral recommendations:  Engage in social groups to decrease social isolation, create and maintain boundaries, engage in self care  Referral(s): [place] (In Clinic)  I discussed the assessment and treatment plan with the patient and/or parent/guardian. They were provided an opportunity to ask questions and all were answered. They agreed with the plan and demonstrated an understanding of the instructions.   They were advised to call back or seek an in-person evaluation if the symptoms worsen or if the condition fails to improve as anticipated.  Lynnea Ferrier, LCSW

## 2021-12-21 ENCOUNTER — Ambulatory Visit (INDEPENDENT_AMBULATORY_CARE_PROVIDER_SITE_OTHER): Payer: 59 | Admitting: Family Medicine

## 2021-12-21 ENCOUNTER — Encounter (HOSPITAL_BASED_OUTPATIENT_CLINIC_OR_DEPARTMENT_OTHER): Payer: Self-pay | Admitting: Family Medicine

## 2021-12-21 DIAGNOSIS — Z0000 Encounter for general adult medical examination without abnormal findings: Secondary | ICD-10-CM

## 2021-12-21 DIAGNOSIS — Z23 Encounter for immunization: Secondary | ICD-10-CM | POA: Diagnosis not present

## 2021-12-21 MED ORDER — SHINGRIX 50 MCG/0.5ML IM SUSR: 0.5000 mL | Freq: Once | INTRAMUSCULAR | 0 refills | Status: AC

## 2021-12-21 NOTE — Progress Notes (Signed)
Subjective:    CC: Annual Physical Exam  HPI:  Janice Ramirez is a 51 y.o. presenting for annual physical  I reviewed the past medical history, family history, social history, surgical history, and allergies today and no changes were needed.  Please see the problem list section below in epic for further details.  Past Medical History: Past Medical History:  Diagnosis Date   Anxiety    Hypertension 05/10/21   Past Surgical History: Past Surgical History:  Procedure Laterality Date   HERNIA REPAIR  Approx. 1992   Social History: Social History   Socioeconomic History   Marital status: Single    Spouse name: Not on file   Number of children: Not on file   Years of education: Not on file   Highest education level: Not on file  Occupational History   Not on file  Tobacco Use   Smoking status: Some Days    Packs/day: 0.10    Years: 20.00    Total pack years: 2.00    Types: Cigarettes, E-cigarettes   Smokeless tobacco: Never   Tobacco comments:    On and off for about 20 yrs  Vaping Use   Vaping Use: Every day  Substance and Sexual Activity   Alcohol use: Yes    Alcohol/week: 3.0 standard drinks of alcohol    Types: 3 Standard drinks or equivalent per week   Drug use: Not Currently    Types: Marijuana    Comment: A few times per year   Sexual activity: Yes    Birth control/protection: Condom  Other Topics Concern   Not on file  Social History Narrative   Recently relocated from [place] Strain: Not on file  Food Insecurity: Not on file  Transportation Needs: Not on file  Physical Activity: Not on file  Stress: Not on file  Social Connections: Not on file   Family History: Family History  Problem Relation Age of Onset   Arthritis Mother    Hypertension Mother    Early death Father    Allergies: No Known Allergies Medications: See med rec.  Review of Systems: No headache, visual changes,  nausea, vomiting, diarrhea, constipation, dizziness, abdominal pain, skin rash, fevers, chills, night sweats, swollen lymph nodes, weight loss, chest pain, body aches, joint swelling, muscle aches, shortness of breath, mood changes, visual or auditory hallucinations.  Objective:    BP 139/86   Pulse 79   Temp 97.6 F (36.4 C) (Oral)   Ht 5' 8"  (1.727 m)   Wt 119 lb 3.2 oz (54.1 kg)   LMP 11/23/2021   SpO2 95%   BMI 18.12 kg/m   General: Well Developed, well nourished, and in no acute distress.  Neuro: Alert and oriented x3, extra-ocular muscles intact, sensation grossly intact. Cranial nerves II through XII are intact, motor, sensory, and coordinative functions are all intact. HEENT: Normocephalic, atraumatic, pupils equal round reactive to light, neck supple, no masses, no lymphadenopathy, thyroid nonpalpable. Oropharynx, nasopharynx, external ear canals are unremarkable. Skin: Warm and dry, no rashes noted.  Cardiac: Regular rate and rhythm, no murmurs rubs or gallops.  Respiratory: Clear to auscultation bilaterally. Not using accessory muscles, speaking in full sentences.  Abdominal: Soft, nontender, nondistended, positive bowel sounds, no masses, no organomegaly.  Musculoskeletal: Shoulder, elbow, wrist, hip, knee, ankle stable, and with full range of motion.  Impression and Recommendations:    Wellness examination Routine HCM labs reviewed. HCM reviewed/discussed. Anticipatory guidance  regarding healthy weight, lifestyle and choices given. Recommend healthy diet.  Recommend approximately 150 minutes/week of moderate intensity exercise Recommend regular dental and vision exams Always use seatbelt/lap and shoulder restraints Recommend using smoke alarms and checking batteries at least twice a year Recommend using sunscreen when outside Discussed colon cancer screening recommendations, options.  Patient had Cologuard completed in [place] before moving, this was about 1 year ago, result  was normal, updated HM in chart Discussed recommendations for shingles vaccine.  Patient amenable, Rx sent to pharmacy Discussed tetanus immunization recommendations, patient agreed to proceed with this today  Return in about 6 months (around 06/22/2022) for HTN. Advised on monitoring BP at home, if having elevated readings, recommend returning sooner for evaluation, consideration for adjustment in medication if elevated  - discussed possibly increasing losartan dose or adding low dose of additional agent (likely indapamide) She will be moving to [facility] she believes in the next couple months as she is now working there and would prefer to avoid a long commute   ___________________________________________ Zyan Mirkin de [place], MD, ABFM, CAQSM Primary Care and [place]

## 2021-12-21 NOTE — Assessment & Plan Note (Signed)
Routine HCM labs reviewed. HCM reviewed/discussed. Anticipatory guidance regarding healthy weight, lifestyle and choices given. Recommend healthy diet.  Recommend approximately 150 minutes/week of moderate intensity exercise Recommend regular dental and vision exams Always use seatbelt/lap and shoulder restraints Recommend using smoke alarms and checking batteries at least twice a year Recommend using sunscreen when outside Discussed colon cancer screening recommendations, options.  Patient had Cologuard completed in [place] before moving, this was about 1 year ago, result was normal, updated HM in chart Discussed recommendations for shingles vaccine.  Patient amenable, Rx sent to pharmacy Discussed tetanus immunization recommendations, patient agreed to proceed with this today

## 2022-05-08 ENCOUNTER — Other Ambulatory Visit (HOSPITAL_BASED_OUTPATIENT_CLINIC_OR_DEPARTMENT_OTHER): Payer: Self-pay | Admitting: Family Medicine

## 2022-05-08 DIAGNOSIS — I10 Essential (primary) hypertension: Secondary | ICD-10-CM

## 2022-06-21 ENCOUNTER — Encounter (HOSPITAL_BASED_OUTPATIENT_CLINIC_OR_DEPARTMENT_OTHER): Payer: Self-pay | Admitting: Family Medicine

## 2022-06-21 ENCOUNTER — Ambulatory Visit (INDEPENDENT_AMBULATORY_CARE_PROVIDER_SITE_OTHER): Payer: Managed Care, Other (non HMO) | Admitting: Family Medicine

## 2022-06-21 VITALS — BP 150/70 | HR 70 | Temp 97.6°F | Ht 68.0 in | Wt 116.4 lb

## 2022-06-21 DIAGNOSIS — Z23 Encounter for immunization: Secondary | ICD-10-CM | POA: Diagnosis not present

## 2022-06-21 DIAGNOSIS — I10 Essential (primary) hypertension: Secondary | ICD-10-CM

## 2022-06-21 MED ORDER — SHINGRIX 50 MCG/0.5ML IM SUSR: 0.5000 mL | Freq: Once | INTRAMUSCULAR | 0 refills | Status: AC

## 2022-06-21 NOTE — Progress Notes (Signed)
    Procedures performed today:    None.  Independent interpretation of notes and tests performed by another provider:   None.  Brief History, Exam, Impression, and Recommendations:    BP (!) 150/70 (BP Location: Left Arm, Patient Position: Sitting, Cuff Size: Normal)   Pulse 70   Temp 97.6 F (36.4 C) (Oral)   Ht 5\' 8"  (1.727 m)   Wt 116 lb 6.4 oz (52.8 kg)   SpO2 100%   BMI 17.70 kg/m   Hypertension She continues with losartan, occasionally may miss a dose here there, but generally has been taking regularly.  She denies any issues with chest pain or headaches.  She has not been checking her blood pressure at home.  She was having epigastric pain at 1 point when taking amlodipine, this has not recurred since switching to losartan. Blood pressure is slightly elevated in office today with systolic being above goal, diastolic at goal.  Cardiovascular exam with regular rate and rhythm, no murmur appreciated At this time, we can continue with current medication regimen.  Recommend patient continue with intermittent monitoring of blood pressure at home, DASH diet  Return in about 3 months (around 09/20/2022) for CPE. Labs at time of office visit. She is interested in receiving shingles vaccine, requested prescription to be sent to pharmacy, sent today    ___________________________________________ Cambrie Sonnenfeld de 11/20/2022, MD, ABFM, CAQSM Primary Care and Sports Medicine [facility]

## 2022-06-21 NOTE — Patient Instructions (Signed)
  Medication Instructions:  Your physician recommends that you continue on your current medications as directed. Please refer to the Current Medication list given to you today. --If you need a refill on any your medications before your next appointment, please call your pharmacy first. If no refills are authorized on file call the office.-- Lab Work: Your physician has recommended that you have lab work today: No If you have labs (blood work) drawn today and your tests are completely normal, you will receive your results via MyChart message OR a phone call from our staff.  Please ensure you check your voicemail in the event that you authorized detailed messages to be left on a delegated number. If you have any lab test that is abnormal or we need to change your treatment, we will call you to review the results.  Referrals/Procedures/Imaging: No  Follow-Up: Your next appointment:   Your physician recommends that you schedule a follow-up appointment  3-4 months with Dr. de [place].  You will receive a text message or e-mail with a link to a survey about your care and experience with [place] today! We would greatly appreciate your feedback!   Thanks for letting us be apart of your health journey!!  Primary Care and Sports Medicine   Dr. Ceasar Mons [place]   We encourage you to activate your patient portal called "MyChart".  Sign up information is provided on this After Visit Summary.  MyChart is used to connect with patients for Virtual Visits (Telemedicine).  Patients are able to view lab/test results, encounter notes, upcoming appointments, etc.  Non-urgent messages can be sent to your provider as well. To learn more about what you can do with MyChart, please visit --  [email or web address].

## 2022-06-21 NOTE — Assessment & Plan Note (Signed)
She continues with losartan, occasionally may miss a dose here there, but generally has been taking regularly.  She denies any issues with chest pain or headaches.  She has not been checking her blood pressure at home.  She was having epigastric pain at 1 point when taking amlodipine, this has not recurred since switching to losartan. Blood pressure is slightly elevated in office today with systolic being above goal, diastolic at goal.  Cardiovascular exam with regular rate and rhythm, no murmur appreciated At this time, we can continue with current medication regimen.  Recommend patient continue with intermittent monitoring of blood pressure at home, DASH diet

## 2022-09-23 ENCOUNTER — Encounter (HOSPITAL_BASED_OUTPATIENT_CLINIC_OR_DEPARTMENT_OTHER): Payer: Managed Care, Other (non HMO) | Admitting: Family Medicine

## 2022-12-20 ENCOUNTER — Encounter (HOSPITAL_BASED_OUTPATIENT_CLINIC_OR_DEPARTMENT_OTHER): Payer: Self-pay | Admitting: Family Medicine

## 2022-12-20 ENCOUNTER — Ambulatory Visit (INDEPENDENT_AMBULATORY_CARE_PROVIDER_SITE_OTHER): Payer: Managed Care, Other (non HMO) | Admitting: Family Medicine

## 2022-12-20 VITALS — BP 173/105 | HR 57 | Ht 68.0 in | Wt 118.4 lb

## 2022-12-20 DIAGNOSIS — Z0000 Encounter for general adult medical examination without abnormal findings: Secondary | ICD-10-CM

## 2022-12-20 DIAGNOSIS — I10 Essential (primary) hypertension: Secondary | ICD-10-CM

## 2022-12-20 MED ORDER — LOSARTAN POTASSIUM 50 MG PO TABS: 50.0000 mg | ORAL_TABLET | Freq: Every day | ORAL | 1 refills | Status: DC

## 2022-12-20 NOTE — Assessment & Plan Note (Signed)
Blood pressure elevated in office today.  Patient reports that she has not taken her blood pressure medication for the last couple weeks.  Partly she is not necessarily wanting to take medication of the long-term and partly because medication supply was running low.  She does have family history of blood pressure issues with her mom reportedly having hypertension. No issues with chest pain, headache, lightheadedness or dizziness. Discussed importance of controlling blood pressure over the long-term in order to reduce risk of issues of persistently elevated blood pressure.  Patient voiced understanding.  She is amenable to resuming medication as prescribed. Recommend intermittent monitoring of blood pressure at home, DASH diet.  Will resume losartan at 50 mg dose.  Will plan to follow-up in about 3 to 4 months to monitor progress

## 2022-12-20 NOTE — Patient Instructions (Signed)
  Medication Instructions:  Your physician recommends that you continue on your current medications as directed. Please refer to the Current Medication list given to you today. --If you need a refill on any your medications before your next appointment, please call your pharmacy first. If no refills are authorized on file call the office.-- Lab Work: Your physician has recommended that you have lab work today: Yes If you have labs (blood work) drawn today and your tests are completely normal, you will receive your results via MyChart message OR a phone call from our staff.  Please ensure you check your voicemail in the event that you authorized detailed messages to be left on a delegated number. If you have any lab test that is abnormal or we need to change your treatment, we will call you to review the results.  Referrals/Procedures/Imaging: No  Follow-Up: Your next appointment:   Your physician recommends that you schedule a follow-up appointment in: 3-4 months with Dr. de [place].  You will receive a text message or e-mail with a link to a survey about your care and experience with us today! We would greatly appreciate your feedback!   Thanks for letting us be apart of your health journey!!  Primary Care and Sports Medicine   Dr. Raymond de [place]   We encourage you to activate your patient portal called "MyChart".  Sign up information is provided on this After Visit Summary.  MyChart is used to connect with patients for Virtual Visits (Telemedicine).  Patients are able to view lab/test results, encounter notes, upcoming appointments, etc.  Non-urgent messages can be sent to your provider as well. To learn more about what you can do with MyChart, please visit --  [email or web address].    

## 2022-12-20 NOTE — Assessment & Plan Note (Signed)
Routine HCM labs ordered. HCM reviewed/discussed. Anticipatory guidance regarding healthy weight, lifestyle and choices given. Recommend healthy diet.  Recommend approximately 150 minutes/week of moderate intensity exercise Recommend regular dental and vision exams Always use seatbelt/lap and shoulder restraints Recommend using smoke alarms and checking batteries at least twice a year Recommend using sunscreen when outside Discussed colon cancer screening recommendations, options.  Patient had Cologuard completed in [place] before moving, this was about 1 year ago, result was normal, updated HM in chart Discussed recommendations for shingles vaccine.  Patient has completed this Discussed tetanus immunization recommendations, patient UTD

## 2022-12-20 NOTE — Progress Notes (Signed)
Subjective:    CC: Annual Physical Exam  HPI:  Janice Ramirez is a 52 y.o. presenting for annual physical  I reviewed the past medical history, family history, social history, surgical history, and allergies today and no changes were needed.  Please see the problem list section below in epic for further details.  Past Medical History: Past Medical History:  Diagnosis Date   Anxiety    Hypertension 05/10/21   Past Surgical History: Past Surgical History:  Procedure Laterality Date   HERNIA REPAIR  Approx. 1992   Social History: Social History   Socioeconomic History   Marital status: Single    Spouse name: Not on file   Number of children: Not on file   Years of education: Not on file   Highest education level: Not on file  Occupational History   Not on file  Tobacco Use   Smoking status: Some Days    Packs/day: 0.10    Years: 20.00    Additional pack years: 0.00    Total pack years: 2.00    Types: Cigarettes, E-cigarettes   Smokeless tobacco: Never   Tobacco comments:    On and off for about 20 yrs  Vaping Use   Vaping Use: Every day  Substance and Sexual Activity   Alcohol use: Yes    Alcohol/week: 3.0 standard drinks of alcohol    Types: 3 Standard drinks or equivalent per week   Drug use: Not Currently    Types: Marijuana    Comment: A few times per year   Sexual activity: Yes    Birth control/protection: Condom  Other Topics Concern   Not on file  Social History Narrative   Recently relocated from [place]   Social Determinants of Health   Financial Resource Strain: Not on file  Food Insecurity: Not on file  Transportation Needs: Not on file  Physical Activity: Not on file  Stress: Not on file  Social Connections: Not on file   Family History: Family History  Problem Relation Age of Onset   Arthritis Mother    Hypertension Mother    Early death Father    Allergies: No Known Allergies Medications: See med rec.  Review of  Systems: No headache, visual changes, nausea, vomiting, diarrhea, constipation, dizziness, abdominal pain, skin rash, fevers, chills, night sweats, swollen lymph nodes, weight loss, chest pain, body aches, joint swelling, muscle aches, shortness of breath, mood changes, visual or auditory hallucinations.  Objective:    BP (!) 173/105 (BP Location: Right Arm, Patient Position: Sitting, Cuff Size: Normal)   Pulse (!) 57   Ht 5\' 8"  (1.727 m)   Wt 118 lb 6.4 oz (53.7 kg)   SpO2 100%   BMI 18.00 kg/m   General: Well Developed, well nourished, and in no acute distress. Neuro: Alert and oriented x3, extra-ocular muscles intact, sensation grossly intact. Cranial nerves II through XII are intact, motor, sensory, and coordinative functions are all intact. HEENT: Normocephalic, atraumatic, pupils equal round reactive to light, neck supple, no masses, no lymphadenopathy, thyroid nonpalpable. Oropharynx, nasopharynx, external ear canals are unremarkable. Skin: Warm and dry, no rashes noted. Cardiac: Regular rate and rhythm, no murmurs rubs or gallops. Respiratory: Clear to auscultation bilaterally. Not using accessory muscles, speaking in full sentences. Abdominal: Soft, nontender, nondistended, positive bowel sounds, no masses, no organomegaly. Musculoskeletal: Shoulder, elbow, wrist, hip, knee, ankle stable, and with full range of motion.  Impression and Recommendations:    Wellness examination Routine HCM labs ordered. HCM reviewed/discussed.  Anticipatory guidance regarding healthy weight, lifestyle and choices given. Recommend healthy diet.  Recommend approximately 150 minutes/week of moderate intensity exercise Recommend regular dental and vision exams Always use seatbelt/lap and shoulder restraints Recommend using smoke alarms and checking batteries at least twice a year Recommend using sunscreen when outside Discussed colon cancer screening recommendations, options.  Patient had Cologuard  completed in [place] before moving, this was about 1 year ago, result was normal, updated HM in chart Discussed recommendations for shingles vaccine.  Patient has completed this Discussed tetanus immunization recommendations, patient UTD  Hypertension Blood pressure elevated in office today.  Patient reports that she has not taken her blood pressure medication for the last couple weeks.  Partly she is not necessarily wanting to take medication of the long-term and partly because medication supply was running low.  She does have family history of blood pressure issues with her mom reportedly having hypertension. No issues with chest pain, headache, lightheadedness or dizziness. Discussed importance of controlling blood pressure over the long-term in order to reduce risk of issues of persistently elevated blood pressure.  Patient voiced understanding.  She is amenable to resuming medication as prescribed. Recommend intermittent monitoring of blood pressure at home, DASH diet.  Will resume losartan at 50 mg dose.  Will plan to follow-up in about 3 to 4 months to monitor progress  Return in about 4 months (around 04/21/2023) for HTN.   ___________________________________________ Benedetta Sundstrom de [place], MD, ABFM, CAQSM Primary Care and Sports Medicine [facility]

## 2022-12-21 LAB — CBC WITH DIFFERENTIAL/PLATELET
Basophils Absolute: 0.1 10*3/uL (ref 0.0–0.2)
Basos: 2 %
EOS (ABSOLUTE): 0.1 10*3/uL (ref 0.0–0.4)
Eos: 2 %
Hematocrit: 43.3 % (ref 34.0–46.6)
Hemoglobin: 14.6 g/dL (ref 11.1–15.9)
Immature Grans (Abs): 0 10*3/uL (ref 0.0–0.1)
Immature Granulocytes: 0 %
Lymphocytes Absolute: 1.6 10*3/uL (ref 0.7–3.1)
Lymphs: 49 %
MCH: 30.8 pg (ref 26.6–33.0)
MCHC: 33.7 g/dL (ref 31.5–35.7)
MCV: 91 fL (ref 79–97)
Monocytes Absolute: 0.3 10*3/uL (ref 0.1–0.9)
Monocytes: 10 %
Neutrophils Absolute: 1.2 10*3/uL — ABNORMAL LOW (ref 1.4–7.0)
Neutrophils: 37 %
Platelets: 265 10*3/uL (ref 150–450)
RBC: 4.74 x10E6/uL (ref 3.77–5.28)
RDW: 12 % (ref 11.7–15.4)
WBC: 3.2 10*3/uL — ABNORMAL LOW (ref 3.4–10.8)

## 2022-12-21 LAB — LIPID PANEL
Chol/HDL Ratio: 2.4 ratio (ref 0.0–4.4)
Cholesterol, Total: 240 mg/dL — ABNORMAL HIGH (ref 100–199)
HDL: 102 mg/dL (ref >39)
LDL Chol Calc (NIH): 124 mg/dL — ABNORMAL HIGH (ref 0–99)
Triglycerides: 85 mg/dL (ref 0–149)
VLDL Cholesterol Cal: 14 mg/dL (ref 5–40)

## 2022-12-21 LAB — COMPREHENSIVE METABOLIC PANEL
ALT: 11 IU/L (ref 0–32)
AST: 22 IU/L (ref 0–40)
Albumin/Globulin Ratio: 2 (ref 1.2–2.2)
Albumin: 4.8 g/dL (ref 3.8–4.9)
Alkaline Phosphatase: 59 IU/L (ref 44–121)
BUN/Creatinine Ratio: 7 — ABNORMAL LOW (ref 9–23)
BUN: 6 mg/dL (ref 6–24)
Bilirubin Total: 0.4 mg/dL (ref 0.0–1.2)
CO2: 25 mmol/L (ref 20–29)
Calcium: 9.8 mg/dL (ref 8.7–10.2)
Chloride: 99 mmol/L (ref 96–106)
Creatinine, Ser: 0.83 mg/dL (ref 0.57–1.00)
Globulin, Total: 2.4 g/dL (ref 1.5–4.5)
Glucose: 85 mg/dL (ref 70–99)
Potassium: 4.6 mmol/L (ref 3.5–5.2)
Sodium: 139 mmol/L (ref 134–144)
Total Protein: 7.2 g/dL (ref 6.0–8.5)
eGFR: 85 mL/min/{1.73_m2} (ref >59)

## 2022-12-21 LAB — TSH RFX ON ABNORMAL TO FREE T4: TSH: 1.1 u[IU]/mL (ref 0.450–4.500)

## 2022-12-21 LAB — HEMOGLOBIN A1C
Est. average glucose Bld gHb Est-mCnc: 105 mg/dL
Hgb A1c MFr Bld: 5.3 % (ref 4.8–5.6)

## 2023-02-12 ENCOUNTER — Encounter (HOSPITAL_BASED_OUTPATIENT_CLINIC_OR_DEPARTMENT_OTHER): Payer: Self-pay | Admitting: Family Medicine

## 2023-04-24 ENCOUNTER — Ambulatory Visit (HOSPITAL_BASED_OUTPATIENT_CLINIC_OR_DEPARTMENT_OTHER): Payer: Managed Care, Other (non HMO) | Admitting: Family Medicine

## 2023-06-10 ENCOUNTER — Telehealth (HOSPITAL_BASED_OUTPATIENT_CLINIC_OR_DEPARTMENT_OTHER): Payer: Self-pay | Admitting: *Deleted

## 2023-06-10 NOTE — Telephone Encounter (Signed)
LVM to schedule follow up with provider for HTN

## 2023-07-31 ENCOUNTER — Encounter (HOSPITAL_BASED_OUTPATIENT_CLINIC_OR_DEPARTMENT_OTHER): Payer: Self-pay | Admitting: *Deleted

## 2023-07-31 ENCOUNTER — Telehealth (HOSPITAL_BASED_OUTPATIENT_CLINIC_OR_DEPARTMENT_OTHER): Payer: Self-pay | Admitting: *Deleted

## 2023-07-31 NOTE — Telephone Encounter (Signed)
LVM for pt to call the office to schedule follow up with dr Tommi Rumps [place]

## 2023-08-21 ENCOUNTER — Encounter (HOSPITAL_BASED_OUTPATIENT_CLINIC_OR_DEPARTMENT_OTHER): Payer: Self-pay | Admitting: Family Medicine

## 2023-08-21 ENCOUNTER — Ambulatory Visit (INDEPENDENT_AMBULATORY_CARE_PROVIDER_SITE_OTHER): Payer: Medicaid Other | Admitting: Family Medicine

## 2023-08-21 VITALS — BP 147/94 | HR 88 | Ht 69.0 in | Wt 121.6 lb

## 2023-08-21 DIAGNOSIS — I10 Essential (primary) hypertension: Secondary | ICD-10-CM

## 2023-08-21 DIAGNOSIS — Z0000 Encounter for general adult medical examination without abnormal findings: Secondary | ICD-10-CM

## 2023-08-21 NOTE — Patient Instructions (Signed)
  Medication Instructions:  Your physician recommends that you continue on your current medications as directed. Please refer to the Current Medication list given to you today. --If you need a refill on any your medications before your next appointment, please call your pharmacy first. If no refills are authorized on file call the office.--   Follow-Up: Your next appointment:   Your physician recommends that you schedule a follow-up appointment in: 4 month physical with Dr. de [place]  You will receive a text message or e-mail with a link to a survey about your care and experience with us  today! We would greatly appreciate your feedback!   Thanks for letting us  be apart of your health journey!!  Primary Care and Sports Medicine   Dr. Quintin sheerer [place]   We encourage you to activate your patient portal called MyChart.  Sign up information is provided on this After Visit Summary.  MyChart is used to connect with patients for Virtual Visits (Telemedicine).  Patients are able to view lab/test results, encounter notes, upcoming appointments, etc.  Non-urgent messages can be sent to your provider as well. To learn more about what you can do with MyChart, please visit --  [email or web address].

## 2023-08-21 NOTE — Assessment & Plan Note (Addendum)
 Blood pressure borderline in office on initial reading, similar on recheck.  She continues with losartan , has been taking this medication fairly consistently.  Has been checking blood pressure intermittently at home, typically readings will be slightly better than reading in office today.  She denies any issues with headache, chest pain.  Continues to stay active, primarily with walking, yoga. At this time, we can continue with current medication regimen without any changes made today Recommend intermittent monitoring of blood pressure at home, DASH diet Will plan for follow-up in about 4 months to complete physical and monitor blood pressure

## 2023-08-21 NOTE — Progress Notes (Signed)
    Procedures performed today:    None.  Independent interpretation of notes and tests performed by another provider:   None.  Brief History, Exam, Impression, and Recommendations:    BP (!) 147/94 (BP Location: Right Arm, Patient Position: Sitting, Cuff Size: Normal)   Pulse 88   Ht 5' 9 (1.753 m)   Wt 121 lb 9.6 oz (55.2 kg)   SpO2 100%   BMI 17.96 kg/m   Primary hypertension Assessment & Plan: Blood pressure borderline in office on initial reading, similar on recheck.  She continues with losartan , has been taking this medication fairly consistently.  Has been checking blood pressure intermittently at home, typically readings will be slightly better than reading in office today.  She denies any issues with headache, chest pain.  Continues to stay active, primarily with walking, yoga. At this time, we can continue with current medication regimen without any changes made today Recommend intermittent monitoring of blood pressure at home, DASH diet Will plan for follow-up in about 4 months to complete physical and monitor blood pressure   Wellness examination -     CBC with Differential/Platelet; Future -     Comprehensive metabolic panel; Future -     Lipid panel; Future -     TSH Rfx on Abnormal to Free T4; Future  Return in about 4 months (around 12/22/2023) for CPE with fasting labs 1 week prior.   ___________________________________________ Demitrios Molyneux de [place], MD, ABFM, CAQSM Primary Care and Sports Medicine [facility]

## 2023-08-22 ENCOUNTER — Encounter (HOSPITAL_BASED_OUTPATIENT_CLINIC_OR_DEPARTMENT_OTHER): Payer: Self-pay | Admitting: *Deleted

## 2023-08-27 ENCOUNTER — Ambulatory Visit (HOSPITAL_BASED_OUTPATIENT_CLINIC_OR_DEPARTMENT_OTHER): Payer: Medicaid Other | Admitting: Family Medicine

## 2023-12-09 ENCOUNTER — Encounter: Payer: Self-pay | Admitting: Obstetrics and Gynecology

## 2023-12-09 ENCOUNTER — Other Ambulatory Visit (HOSPITAL_COMMUNITY)
Admission: RE | Admit: 2023-12-09 | Discharge: 2023-12-09 | Disposition: A | Discharge status: Home / Self Care | Source: Ambulatory Visit | Attending: Obstetrics and Gynecology | Admitting: Obstetrics and Gynecology

## 2023-12-09 ENCOUNTER — Ambulatory Visit: Admitting: Obstetrics and Gynecology

## 2023-12-09 VITALS — BP 156/87 | HR 70 | Ht 68.0 in | Wt 117.0 lb

## 2023-12-09 DIAGNOSIS — Z01419 Encounter for gynecological examination (general) (routine) without abnormal findings: Secondary | ICD-10-CM | POA: Diagnosis present

## 2023-12-09 DIAGNOSIS — Z1331 Encounter for screening for depression: Secondary | ICD-10-CM | POA: Diagnosis not present

## 2023-12-09 NOTE — Progress Notes (Signed)
 Subjective:     Janice Ramirez is a 53 y.o. female P2 with LMP 02/2023 and BMI 17 who is here for a comprehensive physical exam. The patient reports no problems. She denies any vaginal bleeding since August of last year. She reports some occasional hot flashes. She is sexually active. She denies pelvic pain or abnormal discharge. She denies urinary incontinence. She denies issues with bowel movements  Past Medical History:  Diagnosis Date   Anxiety    Hypertension 05/10/21   Past Surgical History:  Procedure Laterality Date   HERNIA REPAIR  Approx. 1992   Family History  Problem Relation Age of Onset   Arthritis Mother    Hypertension Mother    Early death Father     Social History   Socioeconomic History   Marital status: Single    Spouse name: Not on file   Number of children: Not on file   Years of education: Not on file   Highest education level: Not on file  Occupational History   Not on file  Tobacco Use   Smoking status: Some Days    Current packs/day: 0.10    Average packs/day: 0.1 packs/day for 20.0 years (2.0 ttl pk-yrs)    Types: Cigarettes, E-cigarettes    Passive exposure: Current   Smokeless tobacco: Current   Tobacco comments:    On and off for about 20 yrs  Vaping Use   Vaping status: Every Day  Substance and Sexual Activity   Alcohol use: Yes    Alcohol/week: 3.0 standard drinks of alcohol    Types: 3 Standard drinks or equivalent per week   Drug use: Not Currently    Types: Marijuana    Comment: A few times per year   Sexual activity: Yes    Birth control/protection: Condom  Other Topics Concern   Not on file  Social History Narrative   Recently relocated from [place]    Social Drivers of Health   Financial Resource Strain: Not on file  Food Insecurity: Not on file  Transportation Needs: Not on file  Physical Activity: Not on file  Stress: Not on file  Social Connections: Not on file  Intimate Partner Violence: Not on file    Health Maintenance  Topic Date Due   Pneumococcal Vaccine 15-48 Years old (1 of 2 - PCV) Never done   Zoster Vaccines- Shingrix  (1 of 2) Never done   COVID-19 Vaccine (1 - 2024-25 season) Never done   MAMMOGRAM  12/08/2023   INFLUENZA VACCINE  02/13/2024   Cervical Cancer Screening (HPV/Pap Cotest)  12/04/2026   Colonoscopy  07/15/2030   DTaP/Tdap/Td (2 - Td or Tdap) 12/22/2031   Hepatitis C Screening  Completed   HIV Screening  Completed   HPV VACCINES  Aged Out   Meningococcal B Vaccine  Aged Out       Review of Systems Pertinent items noted in HPI and remainder of comprehensive ROS otherwise negative.   Objective:  Blood pressure (!) 162/95, pulse 70, height 5\' 8"  (1.727 m), weight 117 lb (53.1 kg), last menstrual period 02/25/2023.   GENERAL: Well-developed, well-nourished female in no acute distress.  HEENT: Normocephalic, atraumatic. Sclerae anicteric.  NECK: Supple. Normal thyroid.  LUNGS: Clear to auscultation bilaterally.  HEART: Regular rate and rhythm. BREASTS: Symmetric in size. No palpable masses or lymphadenopathy, skin changes, or nipple drainage. ABDOMEN: Soft, nontender, nondistended. No organomegaly. PELVIC: Normal external female genitalia. Vagina is pink and rugated.  Normal discharge. Normal appearing cervix. Uterus is  normal in size. No adnexal mass or tenderness. Chaperone present during the pelvic exam EXTREMITIES: No cyanosis, clubbing, or edema, 2+ distal pulses.     Assessment:    Healthy female exam.      Plan:    Pap smear collected Screening mammogram ordered Patient is current on colonoscopy STI screening per patient request Patient plans to have health maintenance labs with PCP next week Patient will be contacted with abnormal results Information on menopause provided Encouraged patient to take calcium and vitamin D supplement See After Visit Summary for Counseling Recommendations

## 2023-12-09 NOTE — Progress Notes (Addendum)
 53 y.o. GYN presents for AEX/PAP/STD screening. C/o hot flashes.  BP are elevated today, she last took her meds Yesterday at noon.

## 2023-12-10 LAB — CERVICOVAGINAL ANCILLARY ONLY
Chlamydia: NEGATIVE
Comment: NEGATIVE
Comment: NORMAL
Neisseria Gonorrhea: NEGATIVE

## 2023-12-10 LAB — HEPATITIS B SURFACE ANTIGEN: Hepatitis B Surface Ag: NEGATIVE

## 2023-12-10 LAB — HIV ANTIBODY (ROUTINE TESTING W REFLEX): HIV Screen 4th Generation wRfx: NONREACTIVE

## 2023-12-10 LAB — HEPATITIS C ANTIBODY: Hep C Virus Ab: NONREACTIVE

## 2023-12-10 LAB — RPR: RPR Ser Ql: NONREACTIVE

## 2023-12-11 ENCOUNTER — Other Ambulatory Visit (HOSPITAL_BASED_OUTPATIENT_CLINIC_OR_DEPARTMENT_OTHER): Payer: Medicaid Other

## 2023-12-11 ENCOUNTER — Other Ambulatory Visit (HOSPITAL_BASED_OUTPATIENT_CLINIC_OR_DEPARTMENT_OTHER): Payer: Self-pay | Admitting: Family Medicine

## 2023-12-11 ENCOUNTER — Other Ambulatory Visit (HOSPITAL_COMMUNITY): Payer: Self-pay | Admitting: Family Medicine

## 2023-12-12 ENCOUNTER — Ambulatory Visit (HOSPITAL_BASED_OUTPATIENT_CLINIC_OR_DEPARTMENT_OTHER): Payer: Self-pay | Admitting: Family Medicine

## 2023-12-12 LAB — COMPREHENSIVE METABOLIC PANEL WITH GFR
ALT: 21 IU/L (ref 0–32)
AST: 26 IU/L (ref 0–40)
Albumin: 4.5 g/dL (ref 3.8–4.9)
Alkaline Phosphatase: 54 IU/L (ref 44–121)
BUN/Creatinine Ratio: 14 (ref 9–23)
BUN: 11 mg/dL (ref 6–24)
Bilirubin Total: 0.6 mg/dL (ref 0.0–1.2)
CO2: 25 mmol/L (ref 20–29)
Calcium: 9.6 mg/dL (ref 8.7–10.2)
Chloride: 100 mmol/L (ref 96–106)
Creatinine, Ser: 0.81 mg/dL (ref 0.57–1.00)
Globulin, Total: 2.2 g/dL (ref 1.5–4.5)
Glucose: 87 mg/dL (ref 70–99)
Potassium: 5 mmol/L (ref 3.5–5.2)
Sodium: 139 mmol/L (ref 134–144)
Total Protein: 6.7 g/dL (ref 6.0–8.5)
eGFR: 87 mL/min/{1.73_m2} (ref >59)

## 2023-12-12 LAB — TSH RFX ON ABNORMAL TO FREE T4: TSH: 1.79 u[IU]/mL (ref 0.450–4.500)

## 2023-12-12 LAB — LIPID PANEL
Chol/HDL Ratio: 2.7 ratio (ref 0.0–4.4)
Cholesterol, Total: 251 mg/dL — ABNORMAL HIGH (ref 100–199)
HDL: 93 mg/dL (ref >39)
LDL Chol Calc (NIH): 146 mg/dL — ABNORMAL HIGH (ref 0–99)
Triglycerides: 71 mg/dL (ref 0–149)
VLDL Cholesterol Cal: 12 mg/dL (ref 5–40)

## 2023-12-12 LAB — CBC WITH DIFFERENTIAL/PLATELET
Basophils Absolute: 0 10*3/uL (ref 0.0–0.2)
Basos: 1 %
EOS (ABSOLUTE): 0.1 10*3/uL (ref 0.0–0.4)
Eos: 3 %
Hematocrit: 43.1 % (ref 34.0–46.6)
Hemoglobin: 14.3 g/dL (ref 11.1–15.9)
Immature Grans (Abs): 0 10*3/uL (ref 0.0–0.1)
Immature Granulocytes: 0 %
Lymphocytes Absolute: 1.6 10*3/uL (ref 0.7–3.1)
Lymphs: 53 %
MCH: 30.6 pg (ref 26.6–33.0)
MCHC: 33.2 g/dL (ref 31.5–35.7)
MCV: 92 fL (ref 79–97)
Monocytes Absolute: 0.4 10*3/uL (ref 0.1–0.9)
Monocytes: 11 %
Neutrophils Absolute: 1 10*3/uL — ABNORMAL LOW (ref 1.4–7.0)
Neutrophils: 32 %
Platelets: 251 10*3/uL (ref 150–450)
RBC: 4.68 x10E6/uL (ref 3.77–5.28)
RDW: 11.8 % (ref 11.7–15.4)
WBC: 3.1 10*3/uL — ABNORMAL LOW (ref 3.4–10.8)

## 2023-12-12 LAB — CYTOLOGY - PAP
Comment: NEGATIVE
Comment: NEGATIVE
Comment: NEGATIVE
HPV 16: NEGATIVE
HPV 18 / 45: NEGATIVE
High risk HPV: POSITIVE — AB

## 2023-12-15 ENCOUNTER — Ambulatory Visit: Payer: Self-pay | Admitting: Obstetrics and Gynecology

## 2023-12-19 ENCOUNTER — Encounter (HOSPITAL_BASED_OUTPATIENT_CLINIC_OR_DEPARTMENT_OTHER): Payer: Self-pay | Admitting: Family Medicine

## 2023-12-19 ENCOUNTER — Ambulatory Visit (INDEPENDENT_AMBULATORY_CARE_PROVIDER_SITE_OTHER): Payer: Medicaid Other | Admitting: Family Medicine

## 2023-12-19 VITALS — BP 120/80 | HR 65 | Ht 68.0 in | Wt 116.3 lb

## 2023-12-19 DIAGNOSIS — I10 Essential (primary) hypertension: Secondary | ICD-10-CM

## 2023-12-19 DIAGNOSIS — D709 Neutropenia, unspecified: Secondary | ICD-10-CM | POA: Insufficient documentation

## 2023-12-19 DIAGNOSIS — Z1211 Encounter for screening for malignant neoplasm of colon: Secondary | ICD-10-CM | POA: Diagnosis not present

## 2023-12-19 DIAGNOSIS — Z0000 Encounter for general adult medical examination without abnormal findings: Secondary | ICD-10-CM

## 2023-12-19 MED ORDER — LOSARTAN POTASSIUM 50 MG PO TABS: 50.0000 mg | ORAL_TABLET | Freq: Every day | ORAL | 1 refills | Status: DC

## 2023-12-19 NOTE — Assessment & Plan Note (Signed)
 Routine HCM labs reviewed. HCM reviewed/discussed. Anticipatory guidance regarding healthy weight, lifestyle and choices given. Recommend healthy diet.  Recommend approximately 150 minutes/week of moderate intensity exercise Recommend regular dental and vision exams Always use seatbelt/lap and shoulder restraints Recommend using smoke alarms and checking batteries at least twice a year Recommend using sunscreen when outside Discussed colon cancer screening recommendations, options.  Patient had Cologuard completed in [place] before moving, this was about 3 year ago, result was normal, updated HM in chart - due now, order placed Discussed recommendations for shingles vaccine.  Patient has completed this Discussed immunization recommendations

## 2023-12-19 NOTE — Progress Notes (Signed)
 Subjective:    CC: Annual Physical Exam  HPI:  Janice Ramirez is a 53 y.o. presenting for annual physical  I reviewed the past medical history, family history, social history, surgical history, and allergies today and no changes were needed.  Please see the problem list section below in epic for further details.  Past Medical History: Past Medical History:  Diagnosis Date   Anxiety    Hypertension 05/10/21   Past Surgical History: Past Surgical History:  Procedure Laterality Date   HERNIA REPAIR  Approx. 1992   Social History: Social History   Socioeconomic History   Marital status: Single    Spouse name: Not on file   Number of children: Not on file   Years of education: Not on file   Highest education level: Not on file  Occupational History   Not on file  Tobacco Use   Smoking status: Former    Current packs/day: 0.10    Average packs/day: 0.1 packs/day for 20.0 years (2.0 ttl pk-yrs)    Types: Cigarettes, E-cigarettes    Passive exposure: Past   Smokeless tobacco: Current   Tobacco comments:    On and off for about 20 yrs  Vaping Use   Vaping status: Every Day  Substance and Sexual Activity   Alcohol use: Yes    Alcohol/week: 3.0 standard drinks of alcohol    Types: 3 Standard drinks or equivalent per week   Drug use: Not Currently    Types: Marijuana    Comment: A few times per year   Sexual activity: Yes    Birth control/protection: Condom  Other Topics Concern   Not on file  Social History Narrative   Recently relocated from [place]    Social Drivers of Health   Financial Resource Strain: Low Risk  (12/19/2023)   Overall Financial Resource Strain (CARDIA)    Difficulty of Paying Living Expenses: Not hard at all  Food Insecurity: No Food Insecurity (12/19/2023)   Hunger Vital Sign    Worried About Running Out of Food in the Last Year: Never true    Ran Out of Food in the Last Year: Never true  Transportation Needs: No Transportation Needs  (12/19/2023)   PRAPARE - Administrator, Civil Service (Medical): No    Lack of Transportation (Non-Medical): No  Physical Activity: Sufficiently Active (12/19/2023)   Exercise Vital Sign    Days of Exercise per Week: 7 days    Minutes of Exercise per Session: 60 min  Stress: No Stress Concern Present (12/19/2023)   [employer] of Occupational Health - Occupational Stress Questionnaire    Feeling of Stress : Not at all  Social Connections: Moderately Isolated (12/19/2023)   Social Connection and Isolation Panel [NHANES]    Frequency of Communication with Friends and Family: More than three times a week    Frequency of Social Gatherings with Friends and Family: More than three times a week    Attends Religious Services: Never    Database administrator or Organizations: Yes    Attends Banker Meetings: 1 to 4 times per year    Marital Status: Divorced   Family History: Family History  Problem Relation Age of Onset   Arthritis Mother    Hypertension Mother    Early death Father    Allergies: No Known Allergies Medications: See med rec.  Review of Systems: No headache, visual changes, nausea, vomiting, diarrhea, constipation, dizziness, abdominal pain, skin rash, fevers, chills, night sweats,  swollen lymph nodes, weight loss, chest pain, body aches, joint swelling, muscle aches, shortness of breath, mood changes, visual or auditory hallucinations.  Objective:    BP 120/80 (BP Location: Right Arm, Patient Position: Sitting, Cuff Size: Normal)   Pulse 65   Ht 5\' 8"  (1.727 m)   Wt 116 lb 4.8 oz (52.8 kg)   LMP 02/25/2023 (Exact Date)   SpO2 95%   BMI 17.68 kg/m   General: Well Developed, well nourished, and in no acute distress. Neuro: Alert and oriented x3, extra-ocular muscles intact, sensation grossly intact. Cranial nerves II through XII are intact, motor, sensory, and coordinative functions are all intact. HEENT: Normocephalic, atraumatic, pupils  equal round reactive to light, neck supple, no masses, no lymphadenopathy, thyroid nonpalpable. Oropharynx, nasopharynx, external ear canals are unremarkable. Skin: Warm and dry, no rashes noted. Cardiac: Regular rate and rhythm, no murmurs rubs or gallops. Respiratory: Clear to auscultation bilaterally. Not using accessory muscles, speaking in full sentences. Abdominal: Soft, nontender, nondistended, positive bowel sounds, no masses, no organomegaly. Musculoskeletal: Shoulder, elbow, wrist, hip, knee, ankle stable, and with full range of motion.  Impression and Recommendations:    Wellness examination Assessment & Plan: Routine HCM labs reviewed. HCM reviewed/discussed. Anticipatory guidance regarding healthy weight, lifestyle and choices given. Recommend healthy diet.  Recommend approximately 150 minutes/week of moderate intensity exercise Recommend regular dental and vision exams Always use seatbelt/lap and shoulder restraints Recommend using smoke alarms and checking batteries at least twice a year Recommend using sunscreen when outside Discussed colon cancer screening recommendations, options.  Patient had Cologuard completed in [place] before moving, this was about 3 year ago, result was normal, updated HM in chart - due now, order placed Discussed recommendations for shingles vaccine.  Patient has completed this Discussed immunization recommendations   Colon cancer screening -     Cologuard  Primary hypertension -     Losartan  Potassium; Take 1 tablet (50 mg total) by mouth daily.  Dispense: 90 tablet; Refill: 1  Neutropenia, unspecified type [facility]) Assessment & Plan: Noted on prior labs.  Has been mild and stable over the years.  Discussed potential causes for this, suspect that it is more likely related to benign cause such as Duffy-null Can continue with intermittent monitoring.  Consider excluding nutritional deficiency, however no other laboratory abnormalities to suggest this  currently   Return in about 6 months (around 06/19/2024) for hypertension.   ___________________________________________ Jabir Dahlem de [place], MD, ABFM, CAQSM Primary Care and Sports Medicine [facility]

## 2023-12-19 NOTE — Assessment & Plan Note (Signed)
 Noted on prior labs.  Has been mild and stable over the years.  Discussed potential causes for this, suspect that it is more likely related to benign cause such as Duffy-null Can continue with intermittent monitoring.  Consider excluding nutritional deficiency, however no other laboratory abnormalities to suggest this currently

## 2023-12-19 NOTE — Patient Instructions (Signed)
  Medication Instructions:  Your physician recommends that you continue on your current medications as directed. Please refer to the Current Medication list given to you today. --If you need a refill on any your medications before your next appointment, please call your pharmacy first. If no refills are authorized on file call the office.--   Referrals/Procedures/Imaging: Mammogram 01/12/2024 at 9:20am. Cologuard  Follow-Up: Your next appointment:   Your physician recommends that you schedule a follow-up appointment in: 4-6 month follow up with Dr. de Peru  You will receive a text message or e-mail with a link to a survey about your care and experience with us  today! We would greatly appreciate your feedback!   Thanks for letting us  be apart of your health journey!!  Primary Care and Sports Medicine   Dr. Court Distance Peru   We encourage you to activate your patient portal called "MyChart".  Sign up information is provided on this After Visit Summary.  MyChart is used to connect with patients for Virtual Visits (Telemedicine).  Patients are able to view lab/test results, encounter notes, upcoming appointments, etc.  Non-urgent messages can be sent to your provider as well. To learn more about what you can do with MyChart, please visit --  [email or web address].

## 2023-12-30 LAB — COLOGUARD: COLOGUARD: NEGATIVE

## 2024-01-01 ENCOUNTER — Ambulatory Visit (HOSPITAL_BASED_OUTPATIENT_CLINIC_OR_DEPARTMENT_OTHER): Payer: Self-pay | Admitting: Family Medicine

## 2024-01-02 ENCOUNTER — Encounter (HOSPITAL_BASED_OUTPATIENT_CLINIC_OR_DEPARTMENT_OTHER): Payer: Medicaid Other | Admitting: Family Medicine

## 2024-01-05 ENCOUNTER — Encounter: Payer: Self-pay | Admitting: Obstetrics and Gynecology

## 2024-01-05 ENCOUNTER — Other Ambulatory Visit (HOSPITAL_COMMUNITY)
Admission: RE | Admit: 2024-01-05 | Discharge: 2024-01-05 | Disposition: A | Discharge status: Home / Self Care | Source: Ambulatory Visit | Attending: Obstetrics and Gynecology | Admitting: Obstetrics and Gynecology

## 2024-01-05 ENCOUNTER — Ambulatory Visit (INDEPENDENT_AMBULATORY_CARE_PROVIDER_SITE_OTHER): Admitting: Obstetrics and Gynecology

## 2024-01-05 VITALS — BP 123/81 | HR 66 | Wt 120.5 lb

## 2024-01-05 DIAGNOSIS — R87810 Cervical high risk human papillomavirus (HPV) DNA test positive: Secondary | ICD-10-CM

## 2024-01-05 DIAGNOSIS — Z3202 Encounter for pregnancy test, result negative: Secondary | ICD-10-CM

## 2024-01-05 DIAGNOSIS — R87612 Low grade squamous intraepithelial lesion on cytologic smear of cervix (LGSIL): Secondary | ICD-10-CM | POA: Insufficient documentation

## 2024-01-05 LAB — POCT URINE PREGNANCY: Preg Test, Ur: NEGATIVE

## 2024-01-05 NOTE — Progress Notes (Signed)
 Here for colposcopy. No questions at this time.

## 2024-01-05 NOTE — Progress Notes (Signed)
 53 yo presenting for colposcopy. Patient with abnormal pap smear 11/2023 consisiting of LGSIL + HRHPV. Patient is without any complaints today  Past Medical History:  Diagnosis Date   Anxiety    Hypertension 05/10/21   Past Surgical History:  Procedure Laterality Date   HERNIA REPAIR  Approx. 1992   Family History  Problem Relation Age of Onset   Arthritis Mother    Hypertension Mother    Early death Father    Social History   Tobacco Use   Smoking status: Former    Current packs/day: 0.10    Average packs/day: 0.1 packs/day for 20.0 years (2.0 ttl pk-yrs)    Types: Cigarettes, E-cigarettes    Passive exposure: Past   Smokeless tobacco: Current   Tobacco comments:    On and off for about 20 yrs  Vaping Use   Vaping status: Every Day  Substance Use Topics   Alcohol use: Yes    Alcohol/week: 3.0 standard drinks of alcohol    Types: 3 Standard drinks or equivalent per week   Drug use: Not Currently    Types: Marijuana    Comment: A few times per year   ROS See pertinent in HPI. All other systems reviewed and non contributory Blood pressure 123/81, pulse 66, weight 120 lb 8 oz (54.7 kg), last menstrual period 02/25/2023.  GENERAL: Well-developed, well-nourished female in no acute distress.  PELVIC: Normal external female genitalia. Vagina is pink and rugated.  Normal discharge. Normal appearing cervix.  Chaperone present during the pelvic exam NEURO: alert and oriented x 3  A/P 53 yo here for colposcopy due to LGSIL + HRHPV on recent pap smear Patient given informed consent, signed copy in the chart, time out was performed.  Placed in lithotomy position. Cervix viewed with speculum and colposcope after application of acetic acid.   Colposcopy adequate?  yes Acetowhite lesions?1-2 and 7-9 o'clock Punctation?no Mosaicism?  no Abnormal vasculature?  no Biopsies?yes 2 and 8 o'clock ECC?yes  COMMENTS: Patient was given post procedure instructions.  She will be  contacted with results.  Winton Felt, MD

## 2024-01-06 ENCOUNTER — Ambulatory Visit: Payer: Self-pay | Admitting: Obstetrics and Gynecology

## 2024-01-06 LAB — SURGICAL PATHOLOGY

## 2024-01-12 ENCOUNTER — Ambulatory Visit (HOSPITAL_BASED_OUTPATIENT_CLINIC_OR_DEPARTMENT_OTHER): Admitting: Radiology

## 2024-02-02 ENCOUNTER — Ambulatory Visit (HOSPITAL_BASED_OUTPATIENT_CLINIC_OR_DEPARTMENT_OTHER)
Admission: RE | Admit: 2024-02-02 | Discharge: 2024-02-02 | Disposition: A | Discharge status: Home / Self Care | Source: Ambulatory Visit | Attending: Obstetrics and Gynecology | Admitting: Obstetrics and Gynecology

## 2024-02-02 ENCOUNTER — Encounter (HOSPITAL_BASED_OUTPATIENT_CLINIC_OR_DEPARTMENT_OTHER): Payer: Self-pay

## 2024-02-02 DIAGNOSIS — Z1231 Encounter for screening mammogram for malignant neoplasm of breast: Secondary | ICD-10-CM | POA: Insufficient documentation

## 2024-02-02 DIAGNOSIS — Z01419 Encounter for gynecological examination (general) (routine) without abnormal findings: Secondary | ICD-10-CM | POA: Diagnosis present

## 2024-02-05 ENCOUNTER — Other Ambulatory Visit: Payer: Self-pay | Admitting: Family Medicine

## 2024-02-05 DIAGNOSIS — R928 Other abnormal and inconclusive findings on diagnostic imaging of breast: Secondary | ICD-10-CM

## 2024-04-20 ENCOUNTER — Ambulatory Visit
Admission: RE | Admit: 2024-04-20 | Discharge: 2024-04-20 | Disposition: A | Discharge status: Home / Self Care | Source: Ambulatory Visit | Attending: Family Medicine | Admitting: Family Medicine

## 2024-04-20 DIAGNOSIS — R928 Other abnormal and inconclusive findings on diagnostic imaging of breast: Secondary | ICD-10-CM

## 2024-04-21 ENCOUNTER — Ambulatory Visit (HOSPITAL_BASED_OUTPATIENT_CLINIC_OR_DEPARTMENT_OTHER): Payer: Self-pay | Admitting: Family Medicine

## 2024-05-15 ENCOUNTER — Emergency Department (HOSPITAL_BASED_OUTPATIENT_CLINIC_OR_DEPARTMENT_OTHER)
Admission: EM | Admit: 2024-05-15 | Discharge: 2024-05-15 | Disposition: A | Discharge status: Home / Self Care | Attending: Emergency Medicine | Admitting: Emergency Medicine

## 2024-05-15 ENCOUNTER — Encounter (HOSPITAL_BASED_OUTPATIENT_CLINIC_OR_DEPARTMENT_OTHER): Payer: Self-pay | Admitting: Emergency Medicine

## 2024-05-15 ENCOUNTER — Other Ambulatory Visit: Payer: Self-pay

## 2024-05-15 DIAGNOSIS — Z79899 Other long term (current) drug therapy: Secondary | ICD-10-CM | POA: Insufficient documentation

## 2024-05-15 DIAGNOSIS — N3090 Cystitis, unspecified without hematuria: Secondary | ICD-10-CM | POA: Diagnosis not present

## 2024-05-15 DIAGNOSIS — I10 Essential (primary) hypertension: Secondary | ICD-10-CM | POA: Diagnosis not present

## 2024-05-15 DIAGNOSIS — R350 Frequency of micturition: Secondary | ICD-10-CM | POA: Diagnosis present

## 2024-05-15 LAB — URINALYSIS, W/ REFLEX TO CULTURE (INFECTION SUSPECTED)
Bilirubin Urine: NEGATIVE
Glucose, UA: NEGATIVE mg/dL
Ketones, ur: NEGATIVE mg/dL
Nitrite: NEGATIVE
Protein, ur: 30 mg/dL — AB
Specific Gravity, Urine: 1.02 (ref 1.005–1.030)
WBC, UA: >50 WBC/hpf (ref 0–5)
pH: 7 (ref 5.0–8.0)

## 2024-05-15 LAB — PREGNANCY, URINE: Preg Test, Ur: NEGATIVE

## 2024-05-15 IMAGING — MG MM DIGITAL SCREENING BILAT W/ TOMO AND CAD
8 series · 8 of 24 positions shown · non-contrast
Comparison: None available.
COMPARISON: None available.

Addendum:
CLINICAL DATA: Screening.

EXAM:
DIGITAL SCREENING BILATERAL MAMMOGRAM WITH TOMOSYNTHESIS AND CAD
TECHNIQUE: Bilateral screening digital craniocaudal and mediolateral oblique
mammograms were obtained. Bilateral screening digital breast
tomosynthesis was performed. The images were evaluated with
computer-aided detection.

[R MLO synth-2D]
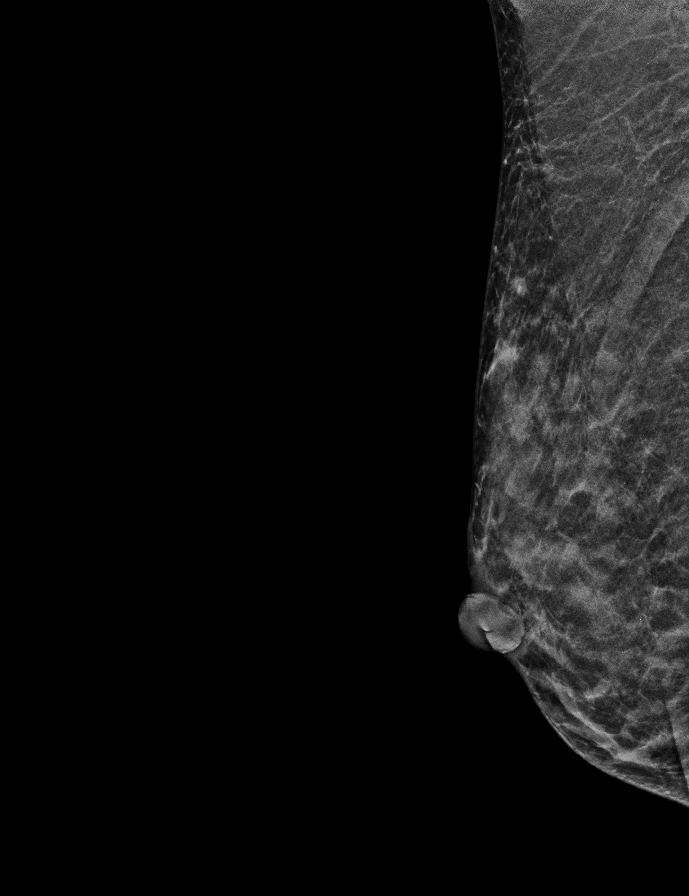

[R CC synth-2D]
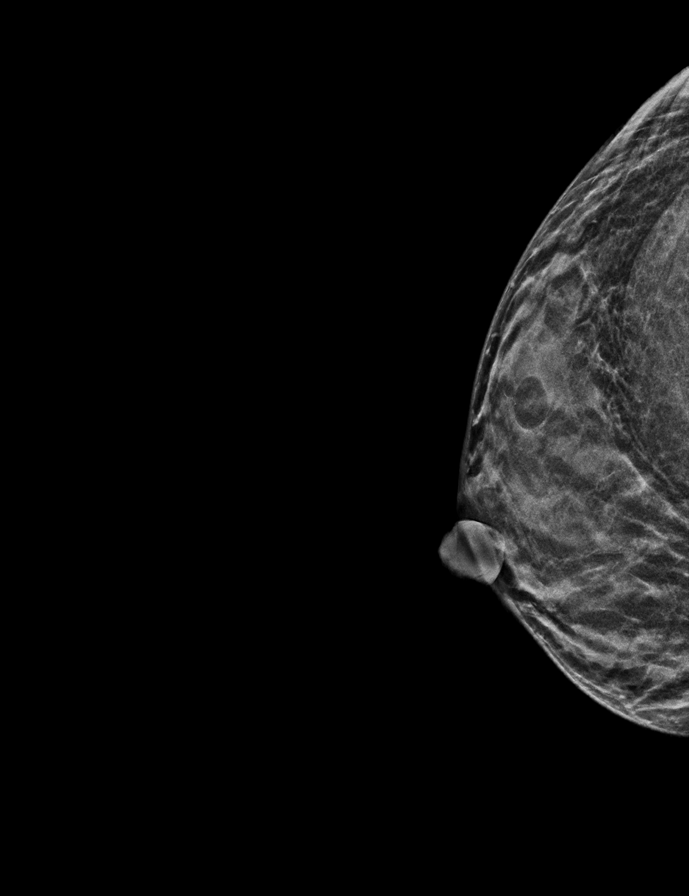

[L CC synth-2D]
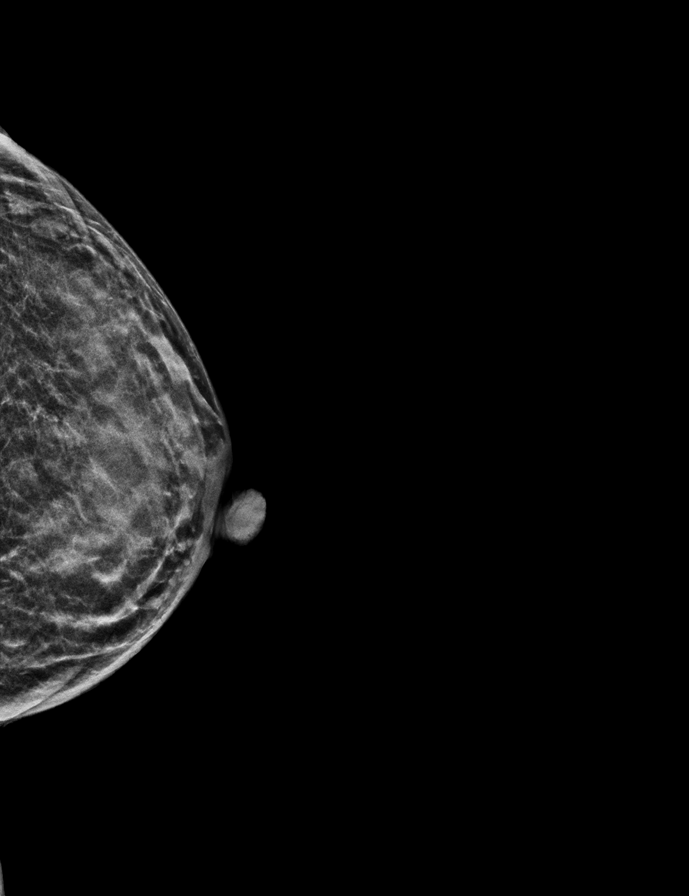

[L MLO synth-2D]
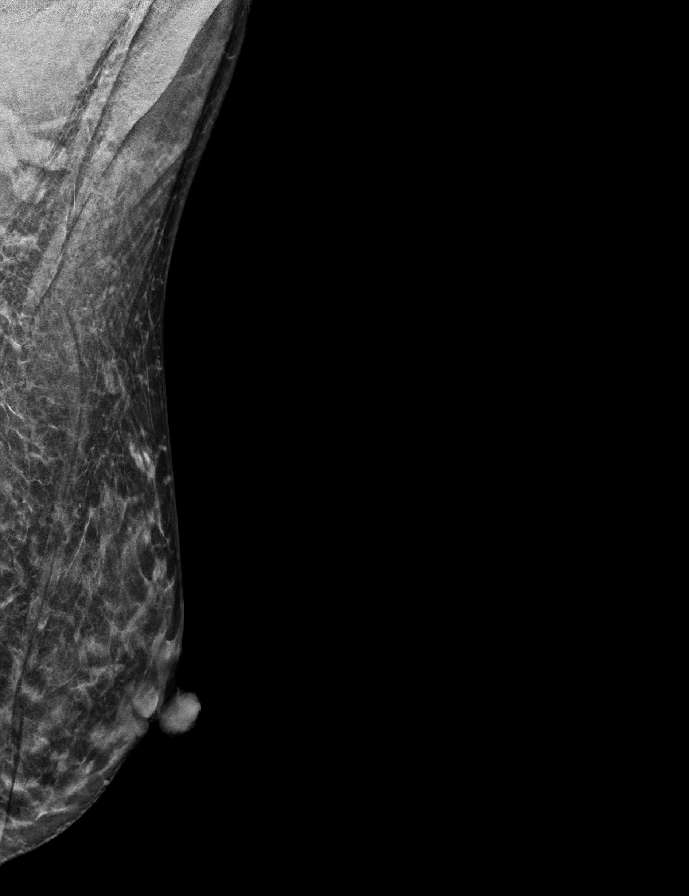

[L CC tomo · tomo slice 17/32.0]
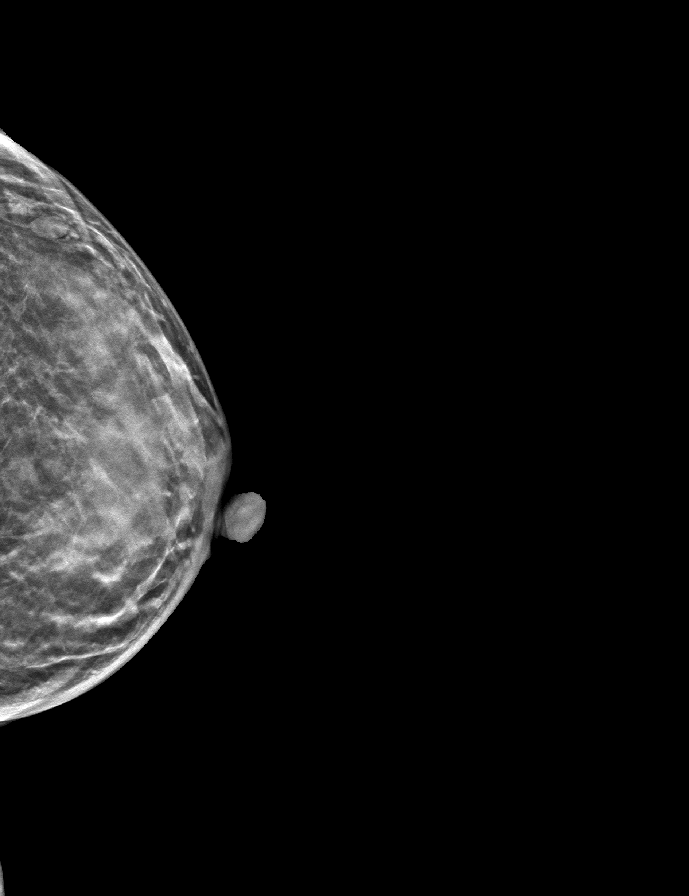

[R MLO tomo · tomo slice 16/31.0]
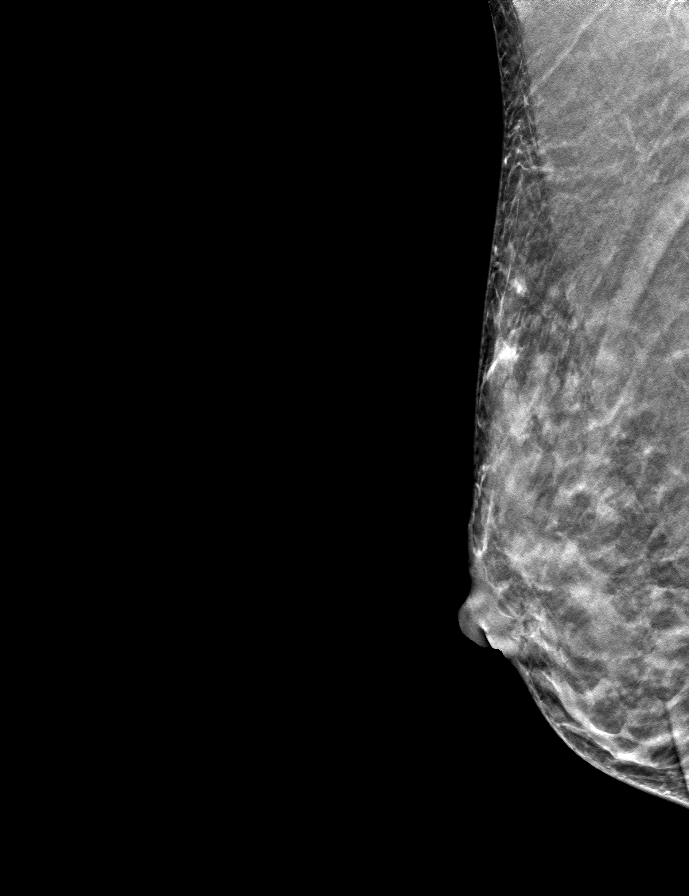

[R CC tomo · tomo slice 16/31.0]
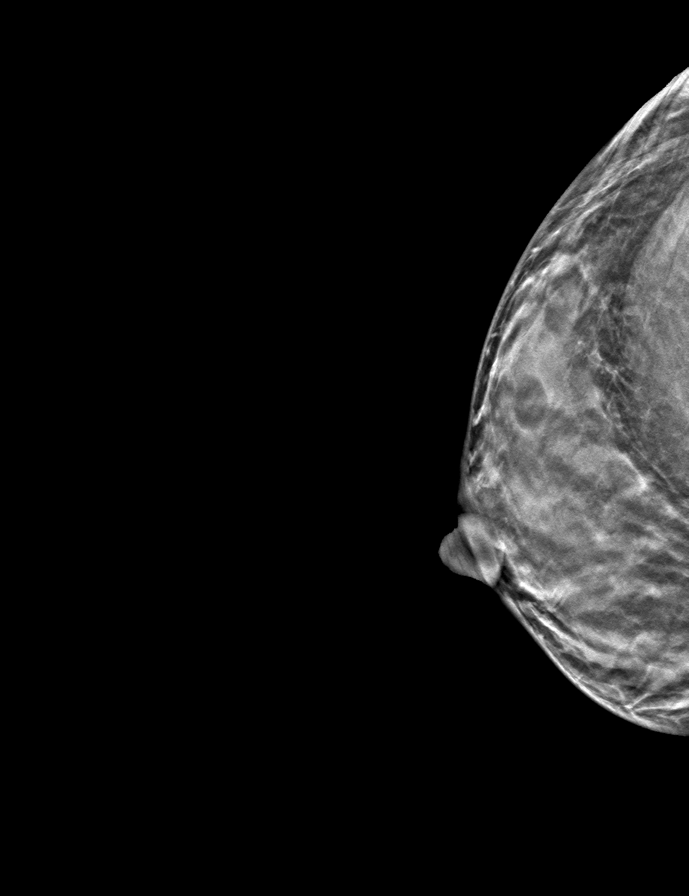

[L MLO tomo · tomo slice 21/41.0]
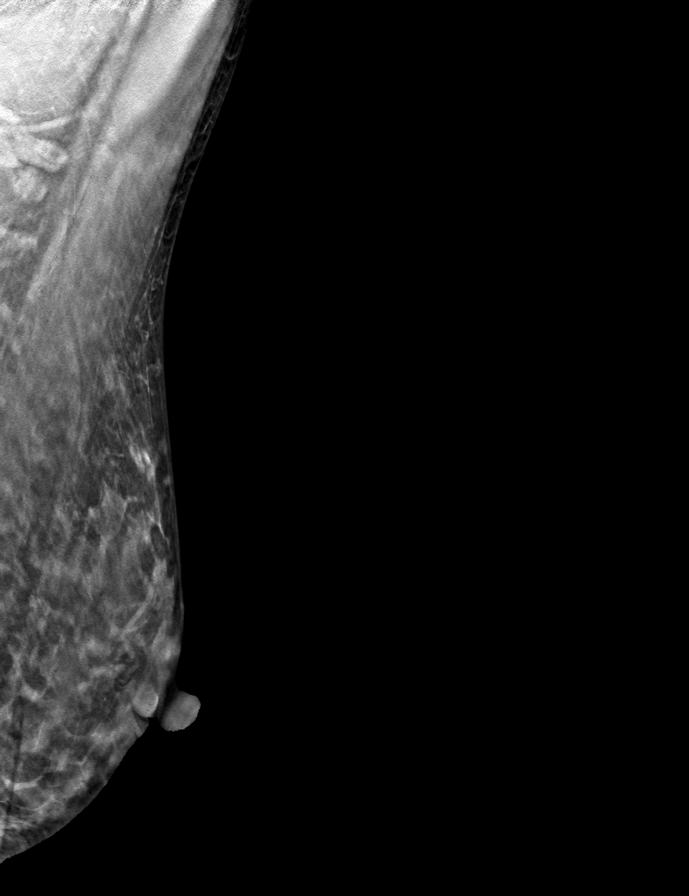

[8 of 24 positions shown; findings below may reference images not displayed]

ACR Breast Density Category c: The breast tissue is heterogeneously
dense, which may obscure small masses
FINDINGS: There are no findings suspicious for malignancy.
IMPRESSION: No mammographic evidence of malignancy. A result letter of this
screening mammogram will be mailed directly to the patient.

RECOMMENDATION:
Screening mammogram in one year. (Code:OO-D-LL4)

BI-RADS CATEGORY  1: Negative.

ADDENDUM:
A previous bilateral screening mammogram dated 09/12/2020 and
bilateral breast ultrasound dated 09/20/2020 from [REDACTED] in [place], have been obtained for comparison. There
has been no significant change since those examinations. The
impression, recommendation and BI-RADS category are unchanged.

*** End of Addendum ***
ACR Breast Density Category c: The breast tissue is heterogeneously
dense, which may obscure small masses
FINDINGS: There are no findings suspicious for malignancy.
IMPRESSION: No mammographic evidence of malignancy. A result letter of this
screening mammogram will be mailed directly to the patient.

RECOMMENDATION:
Screening mammogram in one year. (Code:OO-D-LL4)

BI-RADS CATEGORY  1: Negative.

## 2024-05-15 MED ORDER — CEPHALEXIN 500 MG PO CAPS: 500.0000 mg | ORAL_CAPSULE | Freq: Three times a day (TID) | ORAL | 0 refills | Status: AC

## 2024-05-15 MED ORDER — CEPHALEXIN 250 MG PO CAPS
250.0000 mg | ORAL_CAPSULE | Freq: Once | ORAL | Status: AC
Start: 2024-05-15 — End: 2024-05-15
  Administered 2024-05-15: 250 mg via ORAL
  Filled 2024-05-15: qty 1

## 2024-05-15 NOTE — Discharge Instructions (Signed)
 Janice Ramirez  Thank you for allowing us  to take care of you today.  You came to the Emergency Department today because you have had some pain with urination and urinary frequency over the past several days.  Here in the emergency department we did a urine pregnancy test which was negative, and a urine exam which was positive for a infection.  You do not have any signs or symptoms of a kidney infection (back pain, tenderness over your kidneys on exam), however your symptoms are consistent with a bladder infection.  We will start you on a course of antibiotics, you will take these 3 times a day for the next 5 days.  It usually takes about 2 days for your symptoms to improve.   To-Do: 1. Please follow-up with your primary doctor within 1 - 2 weeks / as soon as possible.   Please return to the Emergency Department or call 911 if you experience have worsening of your symptoms, or do not get better, failure to improve after 48 hours of antibiotics, pain in your back, chest pain, shortness of breath, severe or significantly worsening pain, high fever, severe confusion, pass out or have any reason to think that you need emergency medical care.   We hope you feel better soon.   Mitzie Later, MD Department of Emergency Medicine [facility]

## 2024-05-15 NOTE — ED Triage Notes (Signed)
 Pt c/o urinary urgency and frequency x 2d; c/o lower abd pain

## 2024-05-15 NOTE — ED Provider Notes (Signed)
 [place] [facility] Provider Note   CSN: [id number] Arrival date & time: 05/15/24  1317     History Chief Complaint  Patient presents with   Urinary Frequency    HPI: Janice Ramirez is a 53 y.o. female with history pertinent for hypertension who presents complaining of urinary frequency. Patient arrived via POV accompanied by partner, Janice Ramirez.  History provided by patient.  No interpreter required during this encounter.  Patient reports that that she has had approximately 2 days of urinary frequency, dysuria.  Reports that she has had chills.  Denies fever, chest pain, shortness of breath, nausea, vomiting, diarrhea, back pain.  Reports that it is possible that she could be pregnant.  Patient's recorded medical, surgical, social, medication list and allergies were reviewed in the Snapshot window as part of the initial history.   Prior to Admission medications   Medication Sig Start Date End Date Taking? Authorizing Provider  cephALEXin (KEFLEX) 500 MG capsule Take 1 capsule (500 mg total) by mouth 3 (three) times daily for 5 days. 05/15/24 05/20/24 Yes Rogelia Jerilynn RAMAN, MD  losartan  (COZAAR ) 50 MG tablet Take 1 tablet (50 mg total) by mouth daily. 12/19/23   de [place], Raymond J, MD     Allergies: Patient has no known allergies.   Review of Systems   ROS as per HPI  Physical Exam Updated Vital Signs BP (!) 155/107 (BP Location: Right Arm)   Pulse 76   Temp 98 F (36.7 C)   Resp 16   Ht 5' 9 (1.753 m)   Wt 49 kg   SpO2 100%   BMI 15.95 kg/m  Physical Exam Vitals and nursing note reviewed.  Constitutional:      General: She is not in acute distress.    Appearance: She is well-developed.  HENT:     Head: Normocephalic and atraumatic.  Eyes:     Conjunctiva/sclera: Conjunctivae normal.  Cardiovascular:     Rate and Rhythm: Normal rate and regular rhythm.     Heart sounds: No murmur heard. Pulmonary:     Effort: Pulmonary  effort is normal. No respiratory distress.     Breath sounds: Normal breath sounds.  Abdominal:     Palpations: Abdomen is soft.     Tenderness: There is abdominal tenderness (Very mild, suprapubic). There is no right CVA tenderness, left CVA tenderness, guarding or rebound.  Musculoskeletal:        General: No swelling.     Cervical back: Neck supple.  Skin:    General: Skin is warm and dry.     Capillary Refill: Capillary refill takes less than 2 seconds.  Neurological:     Mental Status: She is alert.  Psychiatric:        Mood and Affect: Mood normal.     ED Course/ Medical Decision Making/ A&P    Procedures Procedures   Medications Ordered in ED Medications  cephALEXin (KEFLEX) capsule 250 mg (250 mg Oral Given 05/15/24 1422)    Medical Decision Making:   Janice Ramirez is a 53 y.o. female who presents for urinary frequency and dysuria as per above.  Physical exam is pertinent for very mild suprapubic tenderness.   The differential includes but is not limited to cystitis, pyelonephritis, nephrolithiasis, pregnancy.  Independent historian: None  External data reviewed: No pertinent external data  Initial Plan:  Urine pregnancy test Urinalysis with reflex culture ordered to evaluate for UTI or relevant urologic/nephrologic pathology.   Labs: Ordered,  Independent interpretation, and Details: Urine pregnancy negative, UA positive for UTI with present LE, bacteria, significant WBCs  Radiology: Not indicated   EKG/Medicine tests: Not indicated EKG Interpretation:    Interventions: Keflex  See the EMR for full details regarding lab and imaging results.  Patient presents for dysuria, suprapubic pain, is well-appearing on exam, no CVA tenderness or back pain concerning for pyelonephritis, no radiating pain, severe pain concerning for nephrolithiasis.  Patient expresses possibility that she could be pregnant, therefore test ordered and this was negative.  UA is  concerning for UTI, therefore will start on course of Keflex.  Given patient otherwise hemodynamically stable, well-appearing, feel that she is stable for discharge and outpatient management.  Patient amenable to this plan, course of Keflex sent to preferred pharmacy.  Presentation is most consistent with acute uncomplicated illness  Discussion of management or test interpretations with external provider(s): Not indicated  Risk Drugs:Prescription drug management  Disposition: DISCHARGE: I believe that the patient is safe for discharge home with outpatient follow-up. Patient was informed of all pertinent physical exam, laboratory, and imaging findings. Patient's suspected etiology of their symptom presentation was discussed with the patient and all questions were answered. We discussed following up with PCP. I provided thorough ED return precautions. The patient feels safe and comfortable with this plan.  MDM generated using voice dictation software and may contain dictation errors.  Please contact me for any clarification or with any questions.  Clinical Impression:  1. Cystitis      Discharge   Final Clinical Impression(s) / ED Diagnoses Final diagnoses:  Cystitis    Rx / DC Orders ED Discharge Orders          Ordered    cephALEXin (KEFLEX) 500 MG capsule  3 times daily        05/15/24 1422             Rogelia Jerilynn RAMAN, MD 05/15/24 1427

## 2024-05-17 LAB — URINE CULTURE: Culture: >=100000 — AB

## 2024-05-18 NOTE — Telephone Encounter (Signed)
 Post ED Visit - Positive Culture Follow-up  Culture report reviewed by antimicrobial stewardship pharmacist: [facility] [x]  Maurilio Patten, Pharm.D. []  Venetia Gully, Pharm.D., BCPS AQ-ID []  Garrel Crews, Pharm.D., BCPS []  Almarie Lunger, Pharm.D., BCPS []  [place], [address].D., BCPS, AAHIVP []  Rosaline Bihari, Pharm.D., BCPS, AAHIVP []  Vernell Meier, PharmD, BCPS []  Latanya Hint, PharmD, BCPS []  Donald Medley, PharmD, BCPS []  Rocky Bold, PharmD []  Dorothyann Alert, PharmD, BCPS []  Morene Babe, PharmD  [facility] []  Rosaline Edison, PharmD []  Romona Bliss, PharmD []  Dolphus Roller, PharmD []  Veva Seip, Rph []  Vernell Daunt) Leonce, PharmD []  Eva Allis, PharmD []  Rosaline Millet, PharmD []  Iantha Batch, PharmD []  Arvin Gauss, PharmD []  Wanda Hasting, PharmD []  Ronal Rav, PharmD []  Rocky Slade, PharmD []  Bard Jeans, PharmD   Positive urine culture Treated with Cephalexin, organism sensitive to the same and no further patient follow-up is required at this time.  Jama Wyman Kipper 05/18/2024, 9:39 AM

## 2024-06-21 ENCOUNTER — Ambulatory Visit (INDEPENDENT_AMBULATORY_CARE_PROVIDER_SITE_OTHER): Admitting: Family Medicine

## 2024-06-21 ENCOUNTER — Encounter (HOSPITAL_BASED_OUTPATIENT_CLINIC_OR_DEPARTMENT_OTHER): Payer: Self-pay | Admitting: Family Medicine

## 2024-06-21 DIAGNOSIS — I10 Essential (primary) hypertension: Secondary | ICD-10-CM

## 2024-06-21 MED ORDER — AMLODIPINE BESYLATE 2.5 MG PO TABS: 2.5000 mg | ORAL_TABLET | Freq: Every day | ORAL | 1 refills | Status: AC

## 2024-06-21 MED ORDER — LOSARTAN POTASSIUM 50 MG PO TABS: 50.0000 mg | ORAL_TABLET | Freq: Every day | ORAL | 1 refills | Status: AC

## 2024-06-21 NOTE — Assessment & Plan Note (Signed)
 Blood pressure is elevated in office today, remains elevated on recheck.  She has not been checking blood pressure at home.  She did have recent ER visit which also showed similar elevation.  No current issues with chest pain or headaches.  She continues with losartan  as prescribed. Given elevation at recent ER visit, elevation here in the office.  We discussed considerations.  Given degree of elevation, would recommend adjusting pharmacotherapy, we reviewed increasing dose of losartan  versus adding second agent at low-dose.  We will add second agent at low-dose.  Chart review shows prior prescription for amlodipine , however patient does not recall any issues with this medication or having taken this medication in the past. We will start with low-dose of amlodipine  at 2.5 mg/day, can continue with losartan  50 mg. We will follow-up on progress in about 1 month to recheck blood pressure and response to medication Recommend intermittent monitoring of blood pressure at home, DASH diet

## 2024-06-21 NOTE — Progress Notes (Signed)
    Procedures performed today:    None.  Independent interpretation of notes and tests performed by another provider:   None.  Brief History, Exam, Impression, and Recommendations:    BP (!) 166/100 (BP Location: Left Arm, Patient Position: Sitting, Cuff Size: Normal)   Pulse 65   Temp 98 F (36.7 C) (Oral)   Resp 20   Ht 5' 9 (1.753 m)   Wt 115 lb (52.2 kg)   SpO2 99%   BMI 16.98 kg/m   Primary hypertension Assessment & Plan: Blood pressure is elevated in office today, remains elevated on recheck.  She has not been checking blood pressure at home.  She did have recent ER visit which also showed similar elevation.  No current issues with chest pain or headaches.  She continues with losartan  as prescribed. Given elevation at recent ER visit, elevation here in the office.  We discussed considerations.  Given degree of elevation, would recommend adjusting pharmacotherapy, we reviewed increasing dose of losartan  versus adding second agent at low-dose.  We will add second agent at low-dose.  Chart review shows prior prescription for amlodipine , however patient does not recall any issues with this medication or having taken this medication in the past. We will start with low-dose of amlodipine  at 2.5 mg/day, can continue with losartan  50 mg. We will follow-up on progress in about 1 month to recheck blood pressure and response to medication Recommend intermittent monitoring of blood pressure at home, DASH diet  Orders: -     Losartan  Potassium; Take 1 tablet (50 mg total) by mouth daily.  Dispense: 90 tablet; Refill: 1  Other orders -     amLODIPine  Besylate; Take 1 tablet (2.5 mg total) by mouth daily.  Dispense: 90 tablet; Refill: 1  Return in about 1 month (around 07/22/2024) for hypertension, med check.   ___________________________________________ Janice Carmean de [place], MD, ABFM, CAQSM Primary Care and Sports Medicine [facility]

## 2024-07-26 ENCOUNTER — Encounter (HOSPITAL_BASED_OUTPATIENT_CLINIC_OR_DEPARTMENT_OTHER): Payer: Self-pay

## 2024-08-03 ENCOUNTER — Ambulatory Visit (HOSPITAL_BASED_OUTPATIENT_CLINIC_OR_DEPARTMENT_OTHER): Admitting: Family Medicine

## 2024-08-11 ENCOUNTER — Ambulatory Visit (INDEPENDENT_AMBULATORY_CARE_PROVIDER_SITE_OTHER): Admitting: Family Medicine

## 2024-08-11 ENCOUNTER — Encounter (HOSPITAL_BASED_OUTPATIENT_CLINIC_OR_DEPARTMENT_OTHER): Payer: Self-pay | Admitting: Family Medicine

## 2024-08-11 VITALS — BP 131/79 | HR 87 | Resp 18 | Ht 69.0 in | Wt 119.0 lb

## 2024-08-11 DIAGNOSIS — I10 Essential (primary) hypertension: Secondary | ICD-10-CM | POA: Diagnosis not present

## 2024-08-11 DIAGNOSIS — Z0000 Encounter for general adult medical examination without abnormal findings: Secondary | ICD-10-CM

## 2024-08-11 NOTE — Assessment & Plan Note (Signed)
 Blood pressure today is much improved compared to last visit.  She has not been checking blood pressure at home.  Denies any issues with medications, we did start with low-dose of amlodipine  at last visit.  No issues with lower extremity swelling.  She has had some recent low-level headaches, but thinks it could be related to screen use. We discussed considerations, would be reasonable to continue with current dose of medications.  No changes made today. Recommend intermittent monitoring of blood pressure at home, DASH diet.

## 2024-08-11 NOTE — Progress Notes (Signed)
" ° ° °  Procedures performed today:    None.  Independent interpretation of notes and tests performed by another provider:   None.  Brief History, Exam, Impression, and Recommendations:    BP 131/79 (BP Location: Right Arm, Patient Position: Sitting, Cuff Size: Normal)   Pulse 87   Resp 18   Ht 5' 9 (1.753 m)   Wt 119 lb (54 kg)   SpO2 100%   BMI 17.57 kg/m   Primary hypertension Assessment & Plan: Blood pressure today is much improved compared to last visit.  She has not been checking blood pressure at home.  Denies any issues with medications, we did start with low-dose of amlodipine  at last visit.  No issues with lower extremity swelling.  She has had some recent low-level headaches, but thinks it could be related to screen use. We discussed considerations, would be reasonable to continue with current dose of medications.  No changes made today. Recommend intermittent monitoring of blood pressure at home, DASH diet.   Wellness examination -     CBC with Differential/Platelet; Future -     Comprehensive metabolic panel with GFR; Future -     Lipid panel; Future -     TSH Rfx on Abnormal to Free T4; Future  Return in about 5 months (around 01/09/2025) for CPE with fasting labs 1 week prior.   ___________________________________________ Aariona Momon de [place], MD, ABFM, CAQSM Primary [facility] Sports [facility] "

## 2025-01-03 ENCOUNTER — Ambulatory Visit (HOSPITAL_BASED_OUTPATIENT_CLINIC_OR_DEPARTMENT_OTHER)

## 2025-01-10 ENCOUNTER — Encounter (HOSPITAL_BASED_OUTPATIENT_CLINIC_OR_DEPARTMENT_OTHER): Admitting: Family Medicine
